# Patient Record
Sex: Female | Born: 1980 | Hispanic: Yes | Marital: Married | Smoking: Never smoker
Health system: Southern US, Community
[De-identification: ages and names within clinical notes are randomized; demographics above are authoritative.]

## PROBLEM LIST (undated history)

## (undated) ENCOUNTER — Inpatient Hospital Stay (HOSPITAL_COMMUNITY): Payer: Self-pay

## (undated) DIAGNOSIS — I8390 Asymptomatic varicose veins of unspecified lower extremity: Secondary | ICD-10-CM

## (undated) DIAGNOSIS — N200 Calculus of kidney: Secondary | ICD-10-CM

## (undated) DIAGNOSIS — M4120 Other idiopathic scoliosis, site unspecified: Secondary | ICD-10-CM

## (undated) DIAGNOSIS — M549 Dorsalgia, unspecified: Secondary | ICD-10-CM

## (undated) HISTORY — PX: ABDOMINAL SURGERY: SHX537

## (undated) HISTORY — DX: Dorsalgia, unspecified: M54.9

## (undated) HISTORY — PX: WISDOM TOOTH EXTRACTION: SHX21

## (undated) HISTORY — PX: APPENDECTOMY: SHX54

## (undated) HISTORY — DX: Calculus of kidney: N20.0

## (undated) HISTORY — DX: Asymptomatic varicose veins of unspecified lower extremity: I83.90

## (undated) HISTORY — DX: Other idiopathic scoliosis, site unspecified: M41.20

---

## 2011-09-05 ENCOUNTER — Other Ambulatory Visit: Payer: Self-pay

## 2011-09-05 DIAGNOSIS — I83893 Varicose veins of bilateral lower extremities with other complications: Secondary | ICD-10-CM

## 2011-10-29 ENCOUNTER — Encounter: Payer: Self-pay | Admitting: Vascular Surgery

## 2011-10-30 ENCOUNTER — Encounter: Payer: Self-pay | Admitting: Vascular Surgery

## 2011-10-30 ENCOUNTER — Ambulatory Visit (INDEPENDENT_AMBULATORY_CARE_PROVIDER_SITE_OTHER): Payer: Medicaid Other | Admitting: Vascular Surgery

## 2011-10-30 VITALS — BP 106/63 | HR 66 | Ht 63.0 in | Wt 123.0 lb

## 2011-10-30 DIAGNOSIS — I8390 Asymptomatic varicose veins of unspecified lower extremity: Secondary | ICD-10-CM | POA: Insufficient documentation

## 2011-10-30 NOTE — Progress Notes (Signed)
[facility]  Patient name: Janice Neal MRN: [id number] DOB: [birth year] Sex: female  REASON FOR CONSULT:   Is veins. Referred byKimberly Daphine Deutscher, NP.  HPI: Janice Neal is a 31 y.o. female states that she has had varicose veins for 4 years. She states her symptoms have been gradually increasing over the last several years. She experiences aching pain in her legs and heaviness in her legs. She also experiences itching and burning associated with her varicosities. She stands at work and works at [employer]. She is unaware of any previous history of DVT or phlebitis. Her symptoms are aggravated by standing and relieved with elevation. She has not been wearing compression stockings  Past Medical History  Diagnosis Date  . Varicose veins   . Scoliosis (and kyphoscoliosis), idiopathic   . Backache, unspecified   . Kidney stones     Family History  Problem Relation Age of Onset  . Heart attack Mother   . Hypertension Father   . Other Father     varicose veins  . Hyperlipidemia Sister   . Cancer Brother     lung    SOCIAL HISTORY: History  Substance Use Topics  . Smoking status: Never Smoker   . Smokeless tobacco: Never Used  . Alcohol Use: No    No Known Allergies  No current outpatient prescriptions on file.    REVIEW OF SYSTEMS: Arly.Keller ] denotes positive finding; [  ] denotes negative finding  CARDIOVASCULAR:  [ ]  chest pain   [ ]  chest pressure   [ ]  palpitations   [ ]  orthopnea   [ ]  dyspnea on exertion   [ ]  claudication   [ ]  rest pain   [ ]  DVT   [ ]  phlebitis PULMONARY:   [ ]  productive cough   [ ]  asthma   [ ]  wheezing NEUROLOGIC:   [ ]  weakness  [ ]  paresthesias  [ ]  aphasia  [ ]  amaurosis  [ ]  dizziness HEMATOLOGIC:   [ ]  bleeding problems   [ ]  clotting disorders MUSCULOSKELETAL:  [ ]  joint pain   [ ]  joint swelling [ ]  leg swelling GASTROINTESTINAL: [ ]   blood in stool  [ ]   hematemesis GENITOURINARY:  [ ]   dysuria  [ ]    hematuria PSYCHIATRIC:  [ ]  history of major depression INTEGUMENTARY:  [ ]  rashes  [ ]  ulcers CONSTITUTIONAL:  [ ]  fever   [ ]  chills  PHYSICAL EXAM: Filed Vitals:   10/30/11 0931  BP: 106/63  Pulse: 66  Height: 5\' 3"  (1.6 m)  Weight: 123 lb (55.792 kg)  SpO2: 100%   Body mass index is 21.79 kg/(m^2). GENERAL: The patient is a well-nourished female, in no acute distress. The vital signs are documented above. CARDIOVASCULAR: There is a regular rate and rhythm. I do not detect carotid bruits. She has palpable pedal pulses. PULMONARY: There is good air exchange bilaterally without wheezing or rales. ABDOMEN: Soft and non-tender with normal pitched bowel sounds.  MUSCULOSKELETAL: There are no major deformities or cyanosis. NEUROLOGIC: No focal weakness or paresthesias are detected. SKIN: She has telangiectasias and spider veins and or lateral right thigh and lateral left thigh and posterior calf on the left. I do not see any truncal varicosities. I do not see any varicosities that appear to be under significantly increased pressure.  PSYCHIATRIC: The patient has a normal affect.  DATA:  I have reviewed her records that were sent from the referring doctor's  office. She also has a history of scoliosis and history of a kidney stone. Her medical history is otherwise fairly unremarkable.  MEDICAL ISSUES: I've explained I do not see any evidence of significant deep vein reflux or superficial reflux on exam. She has spider veins which could be treated with sclerotherapy although this is not covered by insurance. We have discussed the importance of intermittent leg elevation and explained the proper position for this. I've also written her a prescription for compression stockings with a 20-30 mmHg pressure gradient which would help her when she is standing at work. We will see her back as needed. Based on my exam I did not think she needed a venous duplex scan.  Rhiley Solem S [facility] [place] Beeper: [phone]

## 2012-09-24 ENCOUNTER — Ambulatory Visit (INDEPENDENT_AMBULATORY_CARE_PROVIDER_SITE_OTHER): Payer: BC Managed Care – PPO | Admitting: Obstetrics and Gynecology

## 2012-09-24 DIAGNOSIS — Z3200 Encounter for pregnancy test, result unknown: Secondary | ICD-10-CM

## 2012-09-24 DIAGNOSIS — Z3201 Encounter for pregnancy test, result positive: Secondary | ICD-10-CM

## 2012-09-24 NOTE — Addendum Note (Signed)
Addended by: Franchot Mimes on: 09/24/2012 04:01 PM   Modules accepted: Orders

## 2012-09-25 LAB — HIV ANTIBODY (ROUTINE TESTING W REFLEX): HIV: NONREACTIVE

## 2012-09-25 LAB — OBSTETRIC PANEL
Basophils Absolute: 0 10*3/uL (ref 0.0–0.1)
Basophils Relative: 0 % (ref 0–1)
Eosinophils Absolute: 0 10*3/uL (ref 0.0–0.7)
Eosinophils Relative: 1 % (ref 0–5)
MCH: 30 pg (ref 26.0–34.0)
MCV: 88.4 fL (ref 78.0–100.0)
Platelets: 253 10*3/uL (ref 150–400)
RDW: 13.9 % (ref 11.5–15.5)
WBC: 7.7 10*3/uL (ref 4.0–10.5)

## 2012-09-29 LAB — HEMOGLOBINOPATHY EVALUATION
Hgb A: 97.1 % (ref 96.8–97.8)
Hgb F Quant: 0 % (ref 0.0–2.0)
Hgb S Quant: 0 %

## 2012-10-08 ENCOUNTER — Encounter: Payer: Self-pay | Admitting: Obstetrics & Gynecology

## 2012-10-13 ENCOUNTER — Encounter (HOSPITAL_COMMUNITY): Payer: Self-pay | Admitting: *Deleted

## 2012-10-13 ENCOUNTER — Inpatient Hospital Stay (HOSPITAL_COMMUNITY)
Admission: AD | Admit: 2012-10-13 | Discharge: 2012-10-13 | Disposition: A | Discharge status: Home / Self Care | Payer: BC Managed Care – PPO | Source: Ambulatory Visit | Attending: Obstetrics & Gynecology | Admitting: Obstetrics & Gynecology

## 2012-10-13 DIAGNOSIS — K1379 Other lesions of oral mucosa: Secondary | ICD-10-CM

## 2012-10-13 DIAGNOSIS — R51 Headache: Secondary | ICD-10-CM | POA: Diagnosis present

## 2012-10-13 DIAGNOSIS — K1370 Unspecified lesions of oral mucosa: Secondary | ICD-10-CM | POA: Insufficient documentation

## 2012-10-13 DIAGNOSIS — O99891 Other specified diseases and conditions complicating pregnancy: Secondary | ICD-10-CM | POA: Insufficient documentation

## 2012-10-13 DIAGNOSIS — K0889 Other specified disorders of teeth and supporting structures: Secondary | ICD-10-CM

## 2012-10-13 MED ORDER — AMOXICILLIN 500 MG PO CAPS: 500.0000 mg | ORAL_CAPSULE | Freq: Three times a day (TID) | ORAL | Status: DC

## 2012-10-13 MED ORDER — OXYCODONE-ACETAMINOPHEN 5-325 MG PO TABS
1.0000 | ORAL_TABLET | ORAL | Status: AC
  Administered 2012-10-13: 1 via ORAL
  Filled 2012-10-13: qty 1

## 2012-10-13 MED ORDER — OXYCODONE-ACETAMINOPHEN 5-325 MG PO TABS: 1.0000 | ORAL_TABLET | Freq: Four times a day (QID) | ORAL | Status: DC | PRN

## 2012-10-13 NOTE — MAU Note (Signed)
Pain in R jaw since yesterday, unable to chew, has not seen dentist.  No pregnancy issues.

## 2012-10-13 NOTE — MAU Provider Note (Signed)
History     CSN: [id number]  Arrival date and time: 10/13/12 4098   First Provider Initiated Contact with Patient 10/13/12 1053      Chief Complaint  Patient presents with  . Jaw Pain   HPI:  Janice Neal is a 32yo [phone] with GA of [redacted]weeks presenting with facial pain  -Began yesterday -Ache-like pain involving entire right face, right ear, and right cervical lymph nodes. -Severity is 6-9/10. Ice with benefit. Hasn't tried pain medications. -Suggests causal relationship from buccal lesion secondary to molar irregularity. -Denies recent illnesses, fever, SOB, sore throat, sensory involvement.    Past Medical History  Diagnosis Date  . Varicose veins   . Scoliosis (and kyphoscoliosis), idiopathic   . Backache, unspecified   . Kidney stones     Past Surgical History  Procedure Laterality Date  . Abdominal surgery    . Appendectomy      Family History  Problem Relation Age of Onset  . Heart attack Mother   . Hypertension Father   . Other Father     varicose veins  . Hyperlipidemia Sister   . Cancer Brother     lung    History  Substance Use Topics  . Smoking status: Never Smoker   . Smokeless tobacco: Never Used  . Alcohol Use: No    Allergies: No Known Allergies  Prescriptions prior to admission  Medication Sig Dispense Refill  . Prenatal Vit-Fe Fumarate-FA (PRENATAL MULTIVITAMIN) TABS tablet Take 1 tablet by mouth daily at 12 noon.        Review of Systems  Constitutional: Negative for fever, chills and malaise/fatigue.  HENT: Positive for ear pain and neck pain. Negative for hearing loss and congestion.        No deficit of taste / smell  Eyes: Negative for blurred vision, double vision and pain.  Respiratory: Negative for cough and shortness of breath.   Cardiovascular: Negative for chest pain.   Physical Exam   Blood pressure 97/52, pulse 75, temperature 97.6 F (36.4 C), temperature source Oral, resp. rate 18, height 4\' 11"   (1.499 m), weight 58.968 kg (130 lb), last menstrual period 08/04/2012.  Physical Exam  Constitutional: She appears well-developed and well-nourished. No distress.  HENT:  Head: Normocephalic and atraumatic.  Right Ear: External ear normal.  Left Ear: External ear normal.  Nose: Nose normal.  Mouth/Throat: Oropharyngeal exudate: Unable to be visualized.  Slight swelling of right bucca. Small (1.5cm) white lesion, TTP. No erythema / exudate.  Eyes: Conjunctivae are normal. Right eye exhibits no discharge. Left eye exhibits no discharge. No scleral icterus.  Neck: Normal range of motion. No tracheal deviation present. No thyromegaly present.  Cardiovascular: Normal rate and normal heart sounds.  Exam reveals no gallop and no friction rub.   No murmur heard. Respiratory: Breath sounds normal. No respiratory distress. She has no wheezes. She has no rales.  Lymphadenopathy:    She has cervical adenopathy (Right).  Skin: Skin is warm and dry. She is not diaphoretic. No erythema.    MAU Course  Procedures  MDM The patient's story and PE are consistent with a buccal lesion leading to inflammation and lymphadenopathy of the right face/neck. According to the patient, she has a crooked molar which is damaging her buccal mucosa. She shows no signs of systemic infection (negative feeling of fever/chills, tachycardia, clear lungs, or high temperature.   Assessment and Plan  #Buccal Lesion -Patient provided with dental office list to make an appointment for  eval of molar issues + buccal lesion -Begin Percocet PRN, for pain control -Patient educated on potential to occasionally use Ibuprofen in 1st trimester only, for pain/inflammation -Begin Amoxicillin 500 BID (7 days), for infection.  Joellyn Rued 10/13/2012, 11:30 AM   I have seen this patient and agree with the above PA student's note.  Percocet 5/325 x15 tabs prescribed and information given for Dental providers.  Letter provided  for dental care in pregnancy.   LEFTWICH-KIRBY, Aron Inge Certified Nurse-Midwife

## 2012-10-13 NOTE — MAU Provider Note (Signed)
Attestation of Attending Supervision of Advanced Practitioner (CNM/NP): Evaluation and management procedures were performed by the Advanced Practitioner under my supervision and collaboration. I have reviewed the Advanced Practitioner's note and chart, and I agree with the management and plan.  Janice Bastin H. 1:46 PM   

## 2012-10-27 ENCOUNTER — Ambulatory Visit (INDEPENDENT_AMBULATORY_CARE_PROVIDER_SITE_OTHER): Payer: BC Managed Care – PPO | Admitting: Obstetrics & Gynecology

## 2012-10-27 ENCOUNTER — Encounter: Payer: Self-pay | Admitting: Obstetrics & Gynecology

## 2012-10-27 VITALS — BP 98/61 | Temp 98.8°F | Wt 132.1 lb

## 2012-10-27 DIAGNOSIS — N898 Other specified noninflammatory disorders of vagina: Secondary | ICD-10-CM

## 2012-10-27 DIAGNOSIS — Z3491 Encounter for supervision of normal pregnancy, unspecified, first trimester: Secondary | ICD-10-CM

## 2012-10-27 DIAGNOSIS — Z609 Problem related to social environment, unspecified: Secondary | ICD-10-CM

## 2012-10-27 DIAGNOSIS — O9989 Other specified diseases and conditions complicating pregnancy, childbirth and the puerperium: Secondary | ICD-10-CM

## 2012-10-27 DIAGNOSIS — Z3490 Encounter for supervision of normal pregnancy, unspecified, unspecified trimester: Secondary | ICD-10-CM | POA: Insufficient documentation

## 2012-10-27 DIAGNOSIS — Z789 Other specified health status: Secondary | ICD-10-CM | POA: Insufficient documentation

## 2012-10-27 DIAGNOSIS — Z758 Other problems related to medical facilities and other health care: Secondary | ICD-10-CM | POA: Insufficient documentation

## 2012-10-27 LAB — POCT URINALYSIS DIP (DEVICE)
Bilirubin Urine: NEGATIVE
Hgb urine dipstick: NEGATIVE
Leukocytes, UA: NEGATIVE
Nitrite: NEGATIVE
Urobilinogen, UA: 0.2 mg/dL (ref 0.0–1.0)
pH: 5.5 (ref 5.0–8.0)

## 2012-10-27 LAB — OB RESULTS CONSOLE GC/CHLAMYDIA: Gonorrhea: NEGATIVE

## 2012-10-27 NOTE — Progress Notes (Signed)
   Subjective:    Janice Neal is a G3P1011 at [redacted]w[redacted]d being seen today for her first obstetrical visit.  Her obstetrical history is significant for one term SVD. Patient does intend to breast feed. Pregnancy history fully reviewed. Patient is Spanish-speaking, Spanish interpreter present for this encounter at her request.  Patient reports itchy, vaginal discharge for a few days, no other symptoms.  Filed Vitals:   10/27/12 0825  BP: 98/61  Temp: 98.8 F (37.1 C)  Weight: 132 lb 1.6 oz (59.92 kg)    HISTORY: OB History  Gravida Para Term Preterm AB SAB TAB Ectopic Multiple Living  3    1  1   1     # Outcome Date GA Lbr Len/2nd Weight Sex Delivery Anes PTL Lv  3 CUR           2 TAB           1 GRA              Past Medical History  Diagnosis Date  . Varicose veins   . Scoliosis (and kyphoscoliosis), idiopathic   . Backache, unspecified   . Kidney stones    Past Surgical History  Procedure Laterality Date  . Abdominal surgery    . Appendectomy    . Wisdom tooth extraction     Family History  Problem Relation Age of Onset  . Heart disease Mother   . Hypertension Father   . Other Father     varicose veins  . Heart attack Father   . Hyperlipidemia Sister   . Cancer Brother     lung    Exam    Uterus:     Pelvic Exam:    Perineum: No Hemorrhoids, Normal Perineum   Vulva: normal   Vagina:  normal mucosa, scant white discharge, wet prep done   Cervix: multiparous appearance and significant bleeding s/p pap smear   Adnexa: normal adnexa and no mass, fullness, tenderness   Bony Pelvis: average  System: Breast:  normal appearance, no masses or tenderness   Skin: normal coloration and turgor, no rashes   Neurologic: oriented, normal   Extremities: normal strength, tone, and muscle mass   HEENT PERRLA   Mouth/Teeth mucous membranes moist, pharynx normal without lesions and dental hygiene good   Neck supple and no masses   Cardiovascular: regular rate and  rhythm   Respiratory:  appears well, vitals normal, no respiratory distress, acyanotic, normal RR, neck free of mass or lymphadenopathy, chest clear, no wheezing, crepitations, rhonchi, normal symmetric air entry   Abdomen: soft, non-tender; bowel sounds normal; no masses,  no organomegaly   Urinary: urethral meatus normal    Assessment:    Pregnancy: [id number] Patient Active Problem List   Diagnosis Date Noted  . Supervision of normal pregnancy 10/27/2012  . Language barrier, speaks Spanish but can speak adequate English. 10/27/2012  . Varicose veins 10/30/2011    Plan:   Initial labs reviewed and were normal, will follow up pap smear and wet prep and manage accordingly. Continue prenatal vitamins. Problem list reviewed and updated. Genetic Screening discussed First Screen: ordered. Ultrasound discussed; fetal survey: will be ordered later. Follow up in 4 weeks.   Tereso Newcomer, M.D. 10/27/2012

## 2012-10-27 NOTE — Patient Instructions (Addendum)
Embarazo  Primer trimestre  (Pregnancy - First Trimester)  Durante el acto sexual, millones de espermatozoides entran en la vagina. Slo 1 espermatozoide penetra y fertiliza al vulo mientras se encuentra en la trompa de [place]. Una semana ms tarde, el vulo fertilizado se implanta en la pared del tero. Un embrin comienza a desarrollarse para ser un beb. A las 6 a 8 semanas se forman los ojos y la cara y los latidos del corazn se pueden ver en la ecografa. Al final de las 12 semanas (primer trimestre) todos los rganos del beb estn formados. Ahora que est embarazada, querr hacer todo lo que est a su alcance para tener un beb sano. Dos de las cosas ms importantes son: Lucilla Edin buena atencin prenatal y seguir las indicaciones del profesional que la asiste. La atencin prenatal incluye toda la asistencia mdica que usted recibe antes del nacimiento del beb. Se lleva a cabo para prevenir y tratar problemas durante el embarazo y [place]. [place] visitas prenatales se ONEOK, la presin arterial y se solicitan anlisis de [place]. Esto se hace para asegurarse de que usted est sana y el embarazo progrese normalmente.  Una mujer Adelino 25 a [place]. Sin embargo, si usted tiene sobrepeso o [place], su mdico Chartered certified accountant.  El podr hacerle preguntas y responder todas las que usted le haga.  Durante los exmenes prenatales se solicitan anlisis de [place], cultivos del [place], un [place] y otros anlisis necesarios. Estas pruebas se realizan para controlar su salud y la del beb. Se recomienda que se haga la prueba para el diagnstico del VIH, con su autorizacin. Este es el virus que causa el [place]. Estas pruebas se realizan porque existen medicamentos que podran administrarle para prevenir que el beb nazca con esta infeccin, si usted estuviera infectada y no lo supiera. Los [employer] de sangre  tambin se Insurance underwriter para Actor tipo de [place], si tuvo infecciones previas y Chief Technology Officer sus niveles en la sangre (hemoglobina).  Tener un recuento bajo de hemoglobina (anemia) es comn durante Water quality scientist. Para prevenirla, se administran hierro y vitaminas. En una etapa ms avanzada del [place], le indicarn exmenes de sangre para saber si tiene diabetes, junto con otros anlisis, en caso de que [place].  Es necesario que se haga las pruebas para asegurarse de que usted y el beb estn bien. CAMBIOS DURANTE EL PRIMER TRIMESTRE  Su organismo atravesar numerosos cambios durante el [place]. Estos pueden variar de Ardelia Mems persona a otra. Converse con el mdico acerca los cambios que usted nota y que la preocupan. Ellos son:   El perodo menstrual se detiene.  El vulo y los espermatozoides llevan los genes que determinan cmo seremos. Sus genes y los de su pareja forman el beb. Los genes del varn determinan si ser un nio o una nia.  La circunferencia de la cintura va a ir aumentando y podr sentirse hinchada.  Puede tener Higher education careers adviser (nuseas) y vmitos. Si no puede controlar los vmitos, consulte a su mdico.  Sus mamas comenzarn a agrandarse y sensibilizarse.  Los pezones pueden sobresalir ms y ser ms oscuros.  Tendr necesidad de orinar ms. El dolor al orinar puede significar que usted tiene una infeccin de la vejiga.  Se cansar con facilidad.  Prdida del apetito.  Sentir un fuerte deseo de consumir ciertos alimentos.  Al principio, usted puede ganar o perder un par de kilos.  Podr tener cambios emocionales de un da a otro (entusiasmo por estar embarazada o preocupacin por Water quality scientist y el beb).  Tendr sueos ms vvidos y extraos. INSTRUCCIONES PARA EL CUIDADO EN EL HOGAR   Es muy importante evitar el cigarrillo, el alcohol y los frmacos no recetados Solicitor. Estas sustancias afectan la formacin y el desarrollo del beb.  Evite los productos qumicos durante el embarazo para Tourist information centre manager salud del beb.  Comience las consultas prenatales alrededor de la 12 semana de [place]. Generalmente se programan cada mes al principio y se hacen ms frecuentes en los 2 ltimos meses antes del parto. Cumpla con las citas de control. Siga las indicaciones del mdico con respecto al uso de [place], los anlisis y pruebas de [place], los ejercicios y Engineer, technical sales.  Durante el embarazo debe obtener nutrientes para usted y para su beb. Consuma alimentos balanceados. Elija alimentos como carne, pescado, [place] y otros productos lcteos descremados, vegetales, frutas, panes integrales y cereales. El Viacom informar cul es el aumento de peso ideal.  Las nuseas matinales pueden aliviarse si come algunas galletitas saladas en la cama. Coma dos galletitas antes de levantarse por la maana. Tambin puede comer galletitas sin sal.  Hacer 4 o 5 comidas pequeas en lugar de 3 comidas grandes por da tambin puede K[employer] Energy nuseas y los vmitos.  Beber lquidos Lehman Brothers comidas en lugar de tomarlos durante las comidas tambin puede ayudar a Actor las nuseas y los vmitos.  Puede continuar teniendo [employer] durante todo el embarazo si no hay otros problemas. Los problemas pueden ser una prdida precoz (prematura) de lquido amnitico, sangrado vaginal, o dolor en el vientre (abdominal).  [place], si no tiene restricciones. Consulte con su mdico o terapeuta fsico si no est segura de algunos de sus ejercicios. El mayor aumento de peso se producir en los ltimos 2 trimestres del Media planner. El ejercicio le ayudar a:  Engineer, technical sales.  Mantenerse en forma.  Prepararse para el parto.  La ayudar a perder el peso del embarazo despus de que nazca su beb.  Use un buen sostn o como los que se usan para hacer deportes para Public house manager la sensibilidad de las [place]. Tambin puede serle  til si lo [place] mientras duerme.  Consulte cuando puede comenzar con las clases de pre parto. Comience con las clases cuando estn disponibles.  No utilice la baera con agua caliente, baos turcos y saunas.   Colquese el cinturn de seguridad cuando conduzca. Este la proteger a usted y al beb en caso de accidente.  Evite comer carne cruda y el contacto con los utensilios y desperdicios de los gatos. Estos elementos contienen grmenes que pueden causar defectos de nacimiento en el beb.  El primer trimestre es un buen momento para visitar a su dentista y Leisure centre manager. Es importante mantener los dientes limpios. Use un cepillo de dientes suave y cepllese con ms suavidad durante el [employer] Corporation.  Pida ayuda si tienen necesidades financieras, teraputicas o nutricionales. El profesional podr ayudarla con respecto a estas necesidades, o derivarla a otros especialistas.  No tome medicamentos o hierbas excepto aquellos que Firefighter.  Informe a su mdico si sufre violencia familiar mental o fsica.  Haga una lista de nmeros de telfono de Freight forwarder de la familia, los amigos, el hospital y los departamentos de polica y bomberos.  Escriba sus preguntas. Llvelas cuando concurra a su visita prenatal.  No se  haga duchas vaginales.  No cruce las piernas.  Si usted tiene que estar parada por largos perodos de [place], gire los pies o de pequeos pasos en crculo.  Es posible que tenga ms secreciones vaginales que puedan requerir una toalla higinica. No use tampones o toallas higinicas perfumadas. EL CONSUMO DE MEDICAMENTOS Y FRMACOS DURANTE EL EMBARAZO   Tome las vitaminas para la etapa prenatal tal como se le indic. Las vitaminas deben contener un miligramo de cido flico. Guarde todas las vitaminas fuera del alcance de los nios. La ingestin de slo un par de vitaminas o tabletas que contengan hierro pueden ocasionar la Newmont Mining en un beb o en un nio  pequeo.  Evite el uso de [employer] Mutual of Omaha, incluyendo hierbas, medicamentos de [place], sin receta o que no hayan sido sugeridos por su mdico. Slo tome medicamentos de venta libre o medicamentos recetados para Chief Technology Officer, Environmental health practitioner o fiebre como lo indique su mdico. No tome aspirina, ibuprofeno o naproxeno excepto que su mdico se lo indique.  Infrmele al profesional si consume medicamentos de hierbas.  El alcohol se relaciona con ciertos defectos congnitos. Incluye el sndrome de alcoholismo fetal. Debe evitar absolutamente el consumo de alcohol, en cualquier forma. El fumar causar baja tasa de natalidad y bebs prematuros.  Las drogas ilegales o de la calle son muy perjudiciales para el beb. Estn absolutamente prohibidas. Un beb que nace de American Express, ser adicto al nacer. Ese beb tendr los mismos sntomas de abstinencia que un adulto.  Informe a su mdico acerca de los medicamentos que ha tomado y el motivo por el que los tom. SOLICITE ATENCIN MDICA SI:  Tiene preguntas o preocupaciones relacionadas con el embarazo. Es mejor que llame para formular las preguntas si no puede esperar hasta la prxima visita, que sentirse preocupada por ellas.  SOLICITE ATENCIN MDICA DE INMEDIATO SI:   La temperatura oral le sube a ms de 102 F (38.9 C) o lo que su mdico le indique.  Tiene una prdida de lquido por la vagina (canal de parto). Si sospecha una ruptura de las [place], tmese la temperatura y llame al profesional para informarlo sobre esto.  Observa unas pequeas manchas o una hemorragia vaginal. Notifique al profesional acerca de la cantidad y de cuntos apsitos est utilizando.  Presenta un olor desagradable en la secrecin vaginal y observa un cambio en el color.  Contina con las nuseas y no obtiene alivio de los remedios indicados. Vomita sangre o algo similar a la borra del caf.  Pierde ms de 2 libras (1 Kg) en una semana.  Aumenta ms de 1 Kg  en una semana y [address], las manos, los pies o las piernas hinchados.  Aumenta ms de 2,5 Kg en una semana (aunque no tenga las manos, pies, piernas o el rostro hinchados).  Ha estado expuesta a la rubola y no ha sufrido la enfermedad.  Ha estado expuesta a la quinta enfermedad o a la varicela.  Siente dolor en el vientre (abdominal). Las Federal[employer]Mogul en el ligamento redondo son Neomia Dear causa benigna frecuente de dolor abdominal durante el embarazo. El profesional que la asiste deber evaluarla.  Presenta dolor de Renne Musca, diarrea, dolor al orinar o le falta la respiracin.  Se cae, se ve involucrada en un accidente automovilstico o sufre algn tipo de traumatismo.  En su hogar hay violencia mental o fsica. Document Released: 10/24/2004 Document Revised: 10/09/2011 [facility] Patient Information 2014 [place], [place].  Embarazo - Segundo trimestre (  Pregnancy - Second Trimester) El segundo trimestre del Psychiatrist (del 3 al 6 mes) es un perodo de evolucin rpida para usted y el beb. Hacia el final del sexto mes, el beb mide aproximadamente 23 cm y pesa 680 g. Comenzar a Pharmacologist del beb [place] 18 y las 20 [address] de [place]. Podr sentir las pataditas ("quickening en ingls"). Hay un rpido [employer]. Puede segregar un lquido claro Charity fundraiser) de las [place]. Quizs sienta pequeas contracciones en el vientre (tero) Esto se conoce como falso trabajo de parto o contracciones de [place]. Es como una prctica del trabajo de parto que se produce cuando el beb est listo para salir. Generalmente los problemas de vmitos matinales ya se han superado hacia el final del Medical sales representative. Algunas mujeres desarrollan pequeas manchas oscuras (que se denominan cloasma, mscara del embarazo) en la cara que normalmente se van luego del nacimiento del beb. La exposicin al sol empeora las manchas. Puede desarrollarse acn en algunas mujeres embarazadas, y puede  desaparecer en aquellas que ya tienen acn. EXAMENES PRENATALES  Durante los Manpower Inc, deber seguir realizando pruebas de [place], segn avance el [place]. Estas pruebas se realizan para controlar su salud y la del beb. Tambin se realizan anlisis de sangre para The Northwestern Mutual niveles de [place]. La anemia (bajo nivel de hemoglobina) es frecuente durante el embarazo. Para prevenirla, se administran hierro y vitaminas. Tambin se le realizarn exmenes para saber si tiene diabetes entre las 24 y las 28 semanas del [place]. Podrn repetirle algunas de las Hovnanian Enterprises hicieron previamente.  En cada visita le medirn el tamao del tero. Esto se realiza para asegurarse de que el beb est creciendo correctamente de acuerdo al estado del [place].  Tambin en cada visita prenatal controlarn su presin arterial. Esto se realiza para asegurarse de que no tenga toxemia.  Se controlar su orina para asegurarse de que no tenga infecciones, diabetes o protena en la orina.  Se controlar su peso regularmente para asegurarse que el aumento ocurre al ritmo indicado. Esto se hace para asegurarse que usted y el beb tienen una evolucin normal.  En algunas ocasiones se realiza una prueba de ultrasonido para confirmar el correcto desarrollo y evolucin del beb. Esta prueba se realiza con ondas sonoras inofensivas para el beb, de modo que el profesional pueda calcular ms precisamente la fecha del [place]. Algunas veces se realizan pruebas especializadas del lquido amnitico que rodea al beb. Esta prueba se denomina amniocentesis. El lquido amnitico se obtiene introduciendo una aguja en el vientre (abdomen). Se realiza para Conservator, museum/gallery en los que existe alguna preocupacin acerca de algn problema gentico que pueda sufrir el beb. En ocasiones se lleva a cabo cerca del final del embarazo, si es necesario inducir al Apple Computer. En este caso se realiza para asegurarse  que los pulmones del beb estn lo suficientemente maduros como para que pueda vivir fuera del tero. CAMBIOS QUE OCURREN EN EL SEGUNDO TRIMESTRE DEL EMBARAZO Su organismo atravesar numerosos cambios durante el Big Lots. Estos pueden variar de Neomia Dear persona a otra. Converse con el profesional que la asiste acerca los cambios que usted note y que la preocupen.  Durante el segundo trimestre probablemente sienta un aumento del apetito. Es normal tener "antojos" de Development worker, community. Esto vara de Neomia Dear persona a otra y de un embarazo a Therapist, art.  El abdomen inferior comenzar a abultarse.  Podr tener la necesidad de Geographical information systems officer con ms frecuencia debido a que el tero  y el beb presionan sobre la vejiga. Tambin es frecuente contraer ms infecciones urinarias durante el [employer] Lots. Puede evitarlas bebiendo gran cantidad de lquidos y vaciando la vejiga antes y despus de Sales promotion account executive.  Podrn aparecer las primeras estras en las caderas, abdomen y [place]. Estos son cambios normales del cuerpo durante el [place]. No existen medicamentos ni ejercicios que puedan prevenir CarMax.  Es posible que comience a desarrollar venas inflamadas y abultadas (varices) en las piernas. El uso de medias de descanso, Optometrist sus pies durante 15 minutos, 3 a 4 veces al da y Film/video editor la sal en su dieta ayuda a Journalist, newspaper.  Podr sentir Engineering geologist gstrica a medida que el tero crece y Doctor, general practice. Puede tomar anticidos, con la autorizacin de su mdico, para Financial planner. Tambin es til ingerir pequeas comidas 4 a 5 veces al Futures trader.  La constipacin puede tratarse con un laxante o agregando fibra a su dieta. Beber grandes cantidades de lquidos, comer vegetales, frutas y granos integrales es de [place].  Tambin es beneficioso practicar actividad fsica. Si ha sido una persona Engineer, mining, podr continuar con la Harley[employer]Davidson de las actividades durante el mismo. Si ha sido  American Family Insurance, puede ser beneficioso que comience con un programa de ejercicios, Museum/gallery exhibitions officer.  Puede desarrollar hemorroides hacia el final del segundo trimestre. Tomar baos de asiento tibios y Chemical engineer cremas recomendadas por el profesional que lo asiste sern de ayuda para los problemas de hemorroides.  Tambin podr Financial risk analyst de espalda durante este momento de su embarazo. Evite levantar objetos pesados, utilice zapatos de taco bajo y [place] buena postura para ayudar a reducir los problemas de [place].  Algunas mujeres embarazadas desarrollan hormigueo y adormecimiento de la mano y los dedos debido a la hinchazn y compresin de los ligamentos de la mueca (sndrome del tnel carpiano). Esto desaparece una vez que el beb nace.  Como sus pechos se agrandan, Pension scheme manager un sujetador ms grande. Use un sostn de soporte, cmodo y de algodn. No utilice un sostn para amamantar hasta el ltimo mes de embarazo si va a amamantar al beb.  Podr observar una lnea oscura desde el ombligo hacia la zona pbica denominada linea nigra.  Podr observar que sus mejillas se ponen coloradas debido al aumento de flujo sanguneo en la cara.  Podr desarrollar "araitas" en la cara, cuello y pecho. Esto desaparece una vez que el beb nace. INSTRUCCIONES PARA EL CUIDADO DOMICILIARIO  Es extremadamente importante que evite el cigarrillo, hierbas medicinales, alcohol y las drogas no prescriptas durante el Psychiatrist. Estas sustancias qumicas afectan la formacin y el desarrollo del beb. Evite estas sustancias durante todo el embarazo para asegurar el nacimiento de un beb sano.  La mayor parte de los cuidados que se aconsejan son los mismos que los indicados para Financial risk analyst trimestre del Psychiatrist. Cumpla con las citas tal como se le indic. Siga las instrucciones del profesional que lo asiste con respecto al uso de los medicamentos, el ejercicio y Psychologist, forensic.  Durante el embarazo debe obtener  nutrientes para usted y para su beb. Consuma alimentos balanceados a intervalos regulares. Elija alimentos como carne, pescado, [place] y otros productos lcteos descremados, vegetales, frutas, panes integrales y cereales. El Equities trader cul es el aumento de peso ideal.  Las relaciones sexuales fsicas pueden continuarse hasta cerca del fin del embarazo si no existen otros problemas. Estos problemas pueden ser la prdida temprana (prematura) de lquido  amnitico de las [place], sangrado vaginal, dolor abdominal u otros problemas mdicos o del [place].  Realice Tesoro Corporation, si no tiene restricciones. Consulte con el profesional que la asiste si no sabe con certeza si determinados ejercicios son seguros. El mayor aumento de peso tiene Environmental consultant durante los ltimos 2 trimestres del Psychiatrist. El ejercicio la ayudar a:  Engineering geologist.  Ponerla en forma para el parto.  Ayudarla a perder peso luego de haber dado a luz.  Use un buen sostn o como los que se usan para hacer deportes para Paramedic la sensibilidad de las [place]. Tambin puede serle til si lo [place] mientras duerme. Si pierde Product manager, podr Parker Hann[employer]in.  No utilice la baera con agua caliente, baos turcos y saunas durante el [address]  Utilice el cinturn de seguridad sin excepcin cuando conduzca. Este la proteger a usted y al beb en caso de accidente.  Evite comer carne cruda, queso crudo, y el contacto con los utensilios y desperdicios de los gatos. Estos elementos contienen grmenes que pueden causar defectos de nacimiento en el beb.  El segundo trimestre es un buen momento para visitar a su dentista y Software engineer si an no lo ha hecho. Es Primary school teacher los dientes limpios. Utilice un cepillo de dientes blando. Cepllese ms suavemente durante el embarazo.  Es ms fcil perder algo de orina durante el [place]. Apretar y Chief Operating Officer los msculos de la pelvis la  ayudar con este problema. Practique detener la miccin cuando est en el bao. Estos son los mismos msculos que Development worker, international aid. Son TEPPCO Partners mismos msculos que utiliza cuando trata de Ryder System gases. Puede practicar apretando estos msculos 10 veces, y repetir esto 3 veces por da aproximadamente. Una vez que conozca qu msculos debe apretar, no realice estos ejercicios durante la miccin. Puede favorecerle una infeccin si la orina vuelve hacia atrs.  Pida ayuda si tiene necesidades econmicas, de asesoramiento o nutricionales durante el [place]. El profesional podr ayudarla con respecto a estas necesidades, o derivarla a otros especialistas.  La piel puede ponerse grasa. Si esto sucede, lvese la cara con un jabn [place], utilice un humectante no graso y Century con base de aceite o crema. CONSUMO DE MEDICAMENTOS Y DROGAS DURANTE EL EMBARAZO  Contine tomando las vitaminas apropiadas para esta etapa tal como se le indic. Las vitaminas deben contener un miligramo de cido flico y deben suplementarse con hierro. Guarde todas las vitaminas fuera del alcance de los nios. La ingestin de slo un par de vitaminas o tabletas que contengan hierro puede ocasionar la Newmont Mining en un beb o en un nio pequeo.  Evite el uso de [place], inclusive los de venta libre y hierbas que no hayan sido prescriptos o indicados por el profesional que la asiste. Algunos medicamentos pueden causar problemas fsicos al beb. Utilice los medicamentos de venta libre o de prescripcin para Chief Technology Officer, Environmental health practitioner o la [place], segn se lo indique el profesional que lo asiste. No utilice aspirina.  El consumo de alcohol est relacionado con ciertos defectos de nacimiento. Esto incluye el sndrome de alcoholismo fetal. Debe evitar el consumo de alcohol en cualquiera de sus formas. El cigarrillo causa nacimientos prematuros y bebs de [place]. El uso de drogas recreativas est absolutamente prohibido. Son muy  nocivas para el beb. Un beb que nace de American Express, ser adicto al nacer. Ese beb tendr los mismos sntomas de abstinencia que un adulto.  Infrmele al  profesional si consume alguna droga.  No consuma drogas ilegales. Pueden causarle mucho dao al beb. SOLICITE ATENCIN MDICA SI: Tiene preguntas o preocupaciones durante su embarazo. Es mejor que llame para Science writer las dudas que esperar hasta su prxima visita prenatal. Thressa Sheller forma se sentir ms tranquila.  SOLICITE ATENCIN MDICA DE INMEDIATO SI:  La temperatura oral se eleva sin motivo por encima de 102 F (38.9 C) o segn le indique el profesional que lo asiste.  Tiene una prdida de lquido por la vagina (canal de parto). Si sospecha una ruptura de las [place], tmese la temperatura y llame al profesional para informarlo sobre esto.  Observa unas pequeas manchas, una hemorragia vaginal o elimina cogulos. Notifique al profesional acerca de la cantidad y de cuntos apsitos est utilizando. Unas pequeas manchas de sangre son algo comn durante el Psychiatrist, especialmente despus de Sales promotion account executive.  Presenta un olor desagradable en la secrecin vaginal y observa un cambio en el color, de transparente a blanco.  Contina con las nuseas y no obtiene alivio de los remedios indicados. Vomita sangre o algo similar a la borra del caf.  [place] o sube ms de 900 g. en una semana, o segn lo indicado por el profesional que la asiste.  Observa que se le Southwest Airlines, las manos, los pies o las piernas.  Ha estado expuesta a la rubola y no ha sufrido la enfermedad.  Ha estado expuesta a la quinta enfermedad o a la varicela.  Presenta dolor abdominal. Las molestias en el ligamento redondo son Neomia Dear causa no cancerosa (benigna) frecuente de dolor abdominal durante el embarazo. El profesional que la asiste deber evaluarla.  Presenta dolor de cabeza intenso que no se [place].  Presenta fiebre, diarrea, dolor al  orinar o le falta la respiracin.  Presenta dificultad para ver, visin borrosa, o visin doble.  Sufre una cada, un accidente de trnsito o cualquier tipo de trauma.  Vive en un hogar en el que existe violencia fsica o mental. Document Released: 10/24/2004 Document Revised: 10/09/2011 [facility] Patient Information 2014 [place], [place].

## 2012-10-27 NOTE — Progress Notes (Signed)
P = 78  Pt reports intermittent pelvic pain.

## 2012-10-28 LAB — WET PREP, GENITAL: Clue Cells Wet Prep HPF POC: NONE SEEN

## 2012-10-29 ENCOUNTER — Other Ambulatory Visit: Payer: Self-pay | Admitting: Obstetrics and Gynecology

## 2012-10-29 DIAGNOSIS — Z3682 Encounter for antenatal screening for nuchal translucency: Secondary | ICD-10-CM

## 2012-10-30 ENCOUNTER — Other Ambulatory Visit: Payer: Self-pay

## 2012-10-30 ENCOUNTER — Ambulatory Visit (HOSPITAL_COMMUNITY)
Admission: RE | Admit: 2012-10-30 | Discharge: 2012-10-30 | Disposition: A | Discharge status: Home / Self Care | Payer: BC Managed Care – PPO | Source: Ambulatory Visit | Attending: Obstetrics and Gynecology | Admitting: Obstetrics and Gynecology

## 2012-10-30 ENCOUNTER — Encounter (HOSPITAL_COMMUNITY): Payer: Self-pay

## 2012-10-30 DIAGNOSIS — Z3682 Encounter for antenatal screening for nuchal translucency: Secondary | ICD-10-CM

## 2012-10-30 DIAGNOSIS — O351XX0 Maternal care for (suspected) chromosomal abnormality in fetus, not applicable or unspecified: Secondary | ICD-10-CM | POA: Insufficient documentation

## 2012-10-30 DIAGNOSIS — Z3689 Encounter for other specified antenatal screening: Secondary | ICD-10-CM | POA: Insufficient documentation

## 2012-10-30 DIAGNOSIS — O3510X0 Maternal care for (suspected) chromosomal abnormality in fetus, unspecified, not applicable or unspecified: Secondary | ICD-10-CM | POA: Insufficient documentation

## 2012-10-30 NOTE — Progress Notes (Signed)
Ms. Janice Neal was seen for ultrasound appointment today.  Please see [facility]-OBGYN report for details.

## 2012-11-02 ENCOUNTER — Encounter: Payer: Self-pay | Admitting: Obstetrics and Gynecology

## 2012-11-13 ENCOUNTER — Encounter: Payer: Self-pay | Admitting: *Deleted

## 2012-11-24 ENCOUNTER — Encounter: Payer: BC Managed Care – PPO | Admitting: Obstetrics and Gynecology

## 2012-12-01 ENCOUNTER — Ambulatory Visit (INDEPENDENT_AMBULATORY_CARE_PROVIDER_SITE_OTHER): Payer: BC Managed Care – PPO | Admitting: Family Medicine

## 2012-12-01 VITALS — BP 100/64 | Temp 98.0°F | Wt 137.6 lb

## 2012-12-01 DIAGNOSIS — Z3492 Encounter for supervision of normal pregnancy, unspecified, second trimester: Secondary | ICD-10-CM

## 2012-12-01 DIAGNOSIS — Z23 Encounter for immunization: Secondary | ICD-10-CM

## 2012-12-01 DIAGNOSIS — Z3480 Encounter for supervision of other normal pregnancy, unspecified trimester: Secondary | ICD-10-CM

## 2012-12-01 LAB — POCT URINALYSIS DIP (DEVICE)
Bilirubin Urine: NEGATIVE
Ketones, ur: 15 mg/dL — AB
Protein, ur: NEGATIVE mg/dL
Specific Gravity, Urine: >=1.03 (ref 1.005–1.030)
pH: 5.5 (ref 5.0–8.0)

## 2012-12-01 NOTE — Addendum Note (Signed)
Addended by: Minta Balsam on: 12/01/2012 02:47 PM   Modules accepted: Orders

## 2012-12-01 NOTE — Progress Notes (Signed)
+  FM, no lof, no vb, no ctx  Janice Neal is a 32 y.o. G3P1011 at [redacted]w[redacted]d  presents for ROB  Discussed with Patient:  - Reviewed genetics screen (Quad screen ) - Patient plans on breast feeding. - Routine precautions discussed (depression, infection s/s).   Patient provided with all pertinent phone numbers for emergencies. - RTC for any VB, regular, painful cramps/ctxs occurring at a rate of >2/10 min, fever (100.5 or higher), n/v/d, any pain that is unresolving or worsening. - RTC in 4 weeks for next appt.  Problems: Patient Active Problem List   Diagnosis Date Noted  . Supervision of normal pregnancy 10/27/2012  . Language barrier, speaks Spanish but can speak adequate English. 10/27/2012  . Varicose veins 10/30/2011    To Do: 1. 18 week anatomy scan ordered.  Patient to schedule.  [ ]  Vaccines: Flu: 12/01/2012 Tdap:  [ ]  BCM:   Edu: [x ] PTL precautions; [ ]  BF class; [ ]  childbirth class; [ ]   BF counseling

## 2012-12-01 NOTE — Addendum Note (Signed)
Addended by: Candelaria Stagers E on: 12/01/2012 03:02 PM   Modules accepted: Orders

## 2012-12-01 NOTE — Patient Instructions (Signed)
Embarazo - Segundo trimestre (Pregnancy - Second Trimester) El segundo trimestre del embarazo (del 3 al 6 mes) es un perodo de evolucin rpida para usted y el beb. Hacia el final del sexto mes, el beb mide aproximadamente 23 cm y pesa 680 g. Comenzar a sentir los movimientos del beb entre las 18 y las 20 semanas de embarazo. Podr sentir las pataditas ("quickening en ingls"). Hay un rpido aumento de peso. Puede segregar un lquido claro (calostro) de las mamas. Quizs sienta pequeas contracciones en el vientre (tero) Esto se conoce como falso trabajo de parto o contracciones de Braxton-Hicks. Es como una prctica del trabajo de parto que se produce cuando el beb est listo para salir. Generalmente los problemas de vmitos matinales ya se han superado hacia el final del primer trimestre. Algunas mujeres desarrollan pequeas manchas oscuras (que se denominan cloasma, mscara del embarazo) en la cara que normalmente se van luego del nacimiento del beb. La exposicin al sol empeora las manchas. Puede desarrollarse acn en algunas mujeres embarazadas, y puede desaparecer en aquellas que ya tienen acn. EXAMENES PRENATALES  Durante los exmenes prenatales, deber seguir realizando pruebas de sangre, segn avance el embarazo. Estas pruebas se realizan para controlar su salud y la del beb. Tambin se realizan anlisis de sangre para conocer los niveles de hemoglobina. La anemia (bajo nivel de hemoglobina) es frecuente durante el embarazo. Para prevenirla, se administran hierro y vitaminas. Tambin se le realizarn exmenes para saber si tiene diabetes entre las 24 y las 28 semanas del embarazo. Podrn repetirle algunas de las pruebas que le hicieron previamente.  En cada visita le medirn el tamao del tero. Esto se realiza para asegurarse de que el beb est creciendo correctamente de acuerdo al estado del embarazo.  Tambin en cada visita prenatal controlarn su presin arterial. Esto se realiza  para asegurarse de que no tenga toxemia.  Se controlar su orina para asegurarse de que no tenga infecciones, diabetes o protena en la orina.  Se controlar su peso regularmente para asegurarse que el aumento ocurre al ritmo indicado. Esto se hace para asegurarse que usted y el beb tienen una evolucin normal.  En algunas ocasiones se realiza una prueba de ultrasonido para confirmar el correcto desarrollo y evolucin del beb. Esta prueba se realiza con ondas sonoras inofensivas para el beb, de modo que el profesional pueda calcular ms precisamente la fecha del parto. Algunas veces se realizan pruebas especializadas del lquido amnitico que rodea al beb. Esta prueba se denomina amniocentesis. El lquido amnitico se obtiene introduciendo una aguja en el vientre (abdomen). Se realiza para controlar los cromosomas en aquellos casos en los que existe alguna preocupacin acerca de algn problema gentico que pueda sufrir el beb. En ocasiones se lleva a cabo cerca del final del embarazo, si es necesario inducir al parto. En este caso se realiza para asegurarse que los pulmones del beb estn lo suficientemente maduros como para que pueda vivir fuera del tero. CAMBIOS QUE OCURREN EN EL SEGUNDO TRIMESTRE DEL EMBARAZO Su organismo atravesar numerosos cambios durante el embarazo. Estos pueden variar de una persona a otra. Converse con el profesional que la asiste acerca los cambios que usted note y que la preocupen.  Durante el segundo trimestre probablemente sienta un aumento del apetito. Es normal tener "antojos" de ciertas comidas. Esto vara de una persona a otra y de un embarazo a otro.  El abdomen inferior comenzar a abultarse.  Podr tener la necesidad de orinar con ms frecuencia debido a   que el tero y el beb presionan sobre la vejiga. Tambin es frecuente contraer ms infecciones urinarias durante el embarazo. Puede evitarlas bebiendo gran cantidad de lquidos y vaciando la vejiga antes y  despus de mantener relaciones sexuales.  Podrn aparecer las primeras estras en las caderas, abdomen y mamas. Estos son cambios normales del cuerpo durante el embarazo. No existen medicamentos ni ejercicios que puedan prevenir estos cambios.  Es posible que comience a desarrollar venas inflamadas y abultadas (varices) en las piernas. El uso de medias de descanso, elevar sus pies durante 15 minutos, 3 a 4 veces al da y limitar la sal en su dieta ayuda a aliviar el problema.  Podr sentir acidez gstrica a medida que el tero crece y presiona contra el estmago. Puede tomar anticidos, con la autorizacin de su mdico, para aliviar este problema. Tambin es til ingerir pequeas comidas 4 a 5 veces al da.  La constipacin puede tratarse con un laxante o agregando fibra a su dieta. Beber grandes cantidades de lquidos, comer vegetales, frutas y granos integrales es de gran ayuda.  Tambin es beneficioso practicar actividad fsica. Si ha sido una persona activa hasta el embarazo, podr continuar con la mayora de las actividades durante el mismo. Si ha sido menos activa, puede ser beneficioso que comience con un programa de ejercicios, como realizar caminatas.  Puede desarrollar hemorroides hacia el final del segundo trimestre. Tomar baos de asiento tibios y utilizar cremas recomendadas por el profesional que lo asiste sern de ayuda para los problemas de hemorroides.  Tambin podr sentir dolor de espalda durante este momento de su embarazo. Evite levantar objetos pesados, utilice zapatos de taco bajo y mantenga una buena postura para ayudar a reducir los problemas de espalda.  Algunas mujeres embarazadas desarrollan hormigueo y adormecimiento de la mano y los dedos debido a la hinchazn y compresin de los ligamentos de la mueca (sndrome del tnel carpiano). Esto desaparece una vez que el beb nace.  Como sus pechos se agrandan, necesitar un sujetador ms grande. Use un sostn de soporte,  cmodo y de algodn. No utilice un sostn para amamantar hasta el ltimo mes de embarazo si va a amamantar al beb.  Podr observar una lnea oscura desde el ombligo hacia la zona pbica denominada linea nigra.  Podr observar que sus mejillas se ponen coloradas debido al aumento de flujo sanguneo en la cara.  Podr desarrollar "araitas" en la cara, cuello y pecho. Esto desaparece una vez que el beb nace. INSTRUCCIONES PARA EL CUIDADO DOMICILIARIO  Es extremadamente importante que evite el cigarrillo, hierbas medicinales, alcohol y las drogas no prescriptas durante el embarazo. Estas sustancias qumicas afectan la formacin y el desarrollo del beb. Evite estas sustancias durante todo el embarazo para asegurar el nacimiento de un beb sano.  La mayor parte de los cuidados que se aconsejan son los mismos que los indicados para el primer trimestre del embarazo. Cumpla con las citas tal como se le indic. Siga las instrucciones del profesional que lo asiste con respecto al uso de los medicamentos, el ejercicio y la dieta.  Durante el embarazo debe obtener nutrientes para usted y para su beb. Consuma alimentos balanceados a intervalos regulares. Elija alimentos como carne, pescado, leche y otros productos lcteos descremados, vegetales, frutas, panes integrales y cereales. El profesional le informar cul es el aumento de peso ideal.  Las relaciones sexuales fsicas pueden continuarse hasta cerca del fin del embarazo si no existen otros problemas. Estos problemas pueden ser la prdida temprana (  prematura) de lquido amnitico de las membranas, sangrado vaginal, dolor abdominal u otros problemas mdicos o del embarazo.  Realice actividad fsica todos los das, si no tiene restricciones. Consulte con el profesional que la asiste si no sabe con certeza si determinados ejercicios son seguros. El mayor aumento de peso tiene lugar durante los ltimos 2 trimestres del embarazo. El ejercicio la ayudar  a:  Controlar su peso.  Ponerla en forma para el parto.  Ayudarla a perder peso luego de haber dado a luz.  Use un buen sostn o como los que se usan para hacer deportes para aliviar la sensibilidad de las mamas. Tambin puede serle til si lo usa mientras duerme. Si pierde calostro, podr utilizar apsitos en el sostn.  No utilice la baera con agua caliente, baos turcos y saunas durante el embarazo.  Utilice el cinturn de seguridad sin excepcin cuando conduzca. Este la proteger a usted y al beb en caso de accidente.  Evite comer carne cruda, queso crudo, y el contacto con los utensilios y desperdicios de los gatos. Estos elementos contienen grmenes que pueden causar defectos de nacimiento en el beb.  El segundo trimestre es un buen momento para visitar a su dentista y evaluar su salud dental si an no lo ha hecho. Es importante mantener los dientes limpios. Utilice un cepillo de dientes blando. Cepllese ms suavemente durante el embarazo.  Es ms fcil perder algo de orina durante el embarazo. Apretar y fortalecer los msculos de la pelvis la ayudar con este problema. Practique detener la miccin cuando est en el bao. Estos son los mismos msculos que necesita fortalecer. Son tambin los mismos msculos que utiliza cuando trata de evitar los gases. Puede practicar apretando estos msculos 10 veces, y repetir esto 3 veces por da aproximadamente. Una vez que conozca qu msculos debe apretar, no realice estos ejercicios durante la miccin. Puede favorecerle una infeccin si la orina vuelve hacia atrs.  Pida ayuda si tiene necesidades econmicas, de asesoramiento o nutricionales durante el embarazo. El profesional podr ayudarla con respecto a estas necesidades, o derivarla a otros especialistas.  La piel puede ponerse grasa. Si esto sucede, lvese la cara con un jabn suave, utilice un humectante no graso y maquillaje con base de aceite o crema. CONSUMO DE MEDICAMENTOS Y DROGAS  DURANTE EL EMBARAZO  Contine tomando las vitaminas apropiadas para esta etapa tal como se le indic. Las vitaminas deben contener un miligramo de cido flico y deben suplementarse con hierro. Guarde todas las vitaminas fuera del alcance de los nios. La ingestin de slo un par de vitaminas o tabletas que contengan hierro puede ocasionar la muerte en un beb o en un nio pequeo.  Evite el uso de medicamentos, inclusive los de venta libre y hierbas que no hayan sido prescriptos o indicados por el profesional que la asiste. Algunos medicamentos pueden causar problemas fsicos al beb. Utilice los medicamentos de venta libre o de prescripcin para el dolor, el malestar o la fiebre, segn se lo indique el profesional que lo asiste. No utilice aspirina.  El consumo de alcohol est relacionado con ciertos defectos de nacimiento. Esto incluye el sndrome de alcoholismo fetal. Debe evitar el consumo de alcohol en cualquiera de sus formas. El cigarrillo causa nacimientos prematuros y bebs de bajo peso. El uso de drogas recreativas est absolutamente prohibido. Son muy nocivas para el beb. Un beb que nace de una madre adicta, ser adicto al nacer. Ese beb tendr los mismos sntomas de abstinencia que un adulto.    Infrmele al profesional si consume alguna droga.  No consuma drogas ilegales. Pueden causarle mucho dao al beb. SOLICITE ATENCIN MDICA SI: Tiene preguntas o preocupaciones durante su embarazo. Es mejor que llame para consultar las dudas que esperar hasta su prxima visita prenatal. De esta forma se sentir ms tranquila.  SOLICITE ATENCIN MDICA DE INMEDIATO SI:  La temperatura oral se eleva sin motivo por encima de 102 F (38.9 C) o segn le indique el profesional que lo asiste.  Tiene una prdida de lquido por la vagina (canal de parto). Si sospecha una ruptura de las membranas, tmese la temperatura y llame al profesional para informarlo sobre esto.  Observa unas pequeas manchas,  una hemorragia vaginal o elimina cogulos. Notifique al profesional acerca de la cantidad y de cuntos apsitos est utilizando. Unas pequeas manchas de sangre son algo comn durante el embarazo, especialmente despus de mantener relaciones sexuales.  Presenta un olor desagradable en la secrecin vaginal y observa un cambio en el color, de transparente a blanco.  Contina con las nuseas y no obtiene alivio de los remedios indicados. Vomita sangre o algo similar a la borra del caf.  Baja o sube ms de 900 g. en una semana, o segn lo indicado por el profesional que la asiste.  Observa que se le hinchan el rostro, las manos, los pies o las piernas.  Ha estado expuesta a la rubola y no ha sufrido la enfermedad.  Ha estado expuesta a la quinta enfermedad o a la varicela.  Presenta dolor abdominal. Las molestias en el ligamento redondo son una causa no cancerosa (benigna) frecuente de dolor abdominal durante el embarazo. El profesional que la asiste deber evaluarla.  Presenta dolor de cabeza intenso que no se alivia.  Presenta fiebre, diarrea, dolor al orinar o le falta la respiracin.  Presenta dificultad para ver, visin borrosa, o visin doble.  Sufre una cada, un accidente de trnsito o cualquier tipo de trauma.  Vive en un hogar en el que existe violencia fsica o mental. Document Released: 10/24/2004 Document Revised: 10/09/2011 [employer] Patient Information 2014 [employer].  

## 2012-12-01 NOTE — Progress Notes (Signed)
P=96 Pt c/o of lower back pain with activity, relieves with rest.  Flu vaccine information given, undecided at this time.

## 2012-12-01 NOTE — Addendum Note (Signed)
Addended by: Minta Balsam on: 12/01/2012 03:21 PM   Modules accepted: Orders

## 2012-12-02 ENCOUNTER — Encounter: Payer: Self-pay | Admitting: *Deleted

## 2012-12-03 LAB — ALPHA FETOPROTEIN, MATERNAL
AFP: 40.5 IU/mL
Curr Gest Age: 17 wks.days
MoM for AFP: 1.16

## 2012-12-15 ENCOUNTER — Ambulatory Visit (HOSPITAL_COMMUNITY)
Admission: RE | Admit: 2012-12-15 | Discharge: 2012-12-15 | Disposition: A | Discharge status: Home / Self Care | Payer: BC Managed Care – PPO | Source: Ambulatory Visit | Attending: Family Medicine | Admitting: Family Medicine

## 2012-12-15 ENCOUNTER — Ambulatory Visit (HOSPITAL_COMMUNITY): Admission: RE | Admit: 2012-12-15 | Payer: BC Managed Care – PPO | Source: Ambulatory Visit

## 2012-12-15 DIAGNOSIS — Z3689 Encounter for other specified antenatal screening: Secondary | ICD-10-CM | POA: Insufficient documentation

## 2012-12-15 DIAGNOSIS — Z3492 Encounter for supervision of normal pregnancy, unspecified, second trimester: Secondary | ICD-10-CM

## 2012-12-21 ENCOUNTER — Other Ambulatory Visit: Payer: Self-pay | Admitting: Family Medicine

## 2012-12-21 ENCOUNTER — Encounter: Payer: Self-pay | Admitting: Family Medicine

## 2012-12-21 DIAGNOSIS — Z3689 Encounter for other specified antenatal screening: Secondary | ICD-10-CM

## 2012-12-22 ENCOUNTER — Ambulatory Visit (HOSPITAL_COMMUNITY): Payer: BC Managed Care – PPO | Attending: Family Medicine

## 2012-12-30 ENCOUNTER — Ambulatory Visit (INDEPENDENT_AMBULATORY_CARE_PROVIDER_SITE_OTHER): Payer: BC Managed Care – PPO | Admitting: Family Medicine

## 2012-12-30 VITALS — BP 97/61 | Temp 98.1°F | Wt 140.9 lb

## 2012-12-30 DIAGNOSIS — Z3480 Encounter for supervision of other normal pregnancy, unspecified trimester: Secondary | ICD-10-CM

## 2012-12-30 DIAGNOSIS — Z609 Problem related to social environment, unspecified: Secondary | ICD-10-CM

## 2012-12-30 DIAGNOSIS — Z789 Other specified health status: Secondary | ICD-10-CM

## 2012-12-30 DIAGNOSIS — Z3492 Encounter for supervision of normal pregnancy, unspecified, second trimester: Secondary | ICD-10-CM

## 2012-12-30 LAB — POCT URINALYSIS DIP (DEVICE)
Bilirubin Urine: NEGATIVE
Glucose, UA: NEGATIVE mg/dL
Ketones, ur: NEGATIVE mg/dL
Leukocytes, UA: NEGATIVE
Nitrite: NEGATIVE
pH: 8.5 — ABNORMAL HIGH (ref 5.0–8.0)

## 2012-12-30 NOTE — Progress Notes (Signed)
32 yo G3P1011 @ [redacted]w[redacted]d here for ROBV.   - doing well. No concerns.  - no ctx, lof, vb. +FM - some low back pain  O: see flowsheet  A/P:   - doing well- no concerns - PTL precautions discussed - discussed birth certificates  - f/u in 4 weeks - [place] ordered for f/u anatomy

## 2012-12-30 NOTE — Patient Instructions (Addendum)
Segundo trimestre del embarazo  (Second Trimester of Pregnancy) El segundo trimestre del embarazo se extiende desde la semana 13 hasta la semana 28, del 4 al 6 mes. En general, es el momento del embarazo en el que se sentir mejor. Su organismo se ha adaptado a estar embarazada y comienza a sentirse fsicamente mejor. En general las nuseas matutinas han disminuido o han desaparecido completamente. El segundo trimestre es tambin la poca en la que el feto se desarrolla rpidamente. Hacia el final del sexto mes, el beb mide aproximadamente 9 pulgadas (23 cm) y pesa alrededor de 1  libras (700 g). Es probable que sienta mover al beb (dar pataditas) entre las 18 y 20 semanas del embarazo.  CAMBIOS CORPORALES  Su organismo atravesar numerosos cambios durante el embarazo. Los cambios varan de una mujer a otra.   Seguir aumentando de peso. Notar que la parte baja del abdomen se ensancha.  Podrn aparecer las primeras estras en las caderas, abdomen y mamas.  Es posible que sienta cefaleas, que se pueden aliviar con los medicamentos que su mdico le autorice a utilizar.  Tendr necesidad de orinar con ms frecuencia porque el feto est presionando en la vejiga.  Como consecuencia del embarazo, podr sentir acidez estomacal continuamente.  Podr estar constipada ya que ciertas hormonas hacen que los msculos que empujan los desechos a travs de los intestinos trabajen ms lentamente.  Pueden aparecer hemorroides o abultarse las venas (venas varicosas).  El dolor de espalda se debe al aumento de peso y a que las hormonas del embarazo relajan las articulaciones entre los huesos de la pelvis y a la modificacin del peso y a los msculos que soportan el equilibrio.  Sus mamas seguirn desarrollndose y estarn ms sensibles.  Las encas pueden sangrar y estar sensibles al cepillado y al hilo dental.  Pueden aparecer en el rostro zonas oscuras o manchas (cloasma, mscara del embarazo). Esto  desaparece despus del nacimiento del beb.  Es posible que se formeuna lnea oscura desde el ombligo a la zona del pubis (linea nigra). Esto desaparece despus del nacimiento del beb. QU DEBE ESPERAR EN LAS CONSULTAS PRENATALES  Durante una visita prenatal de rutina:   La pesarn para verificar que usted y el feto se encuentran dentro de los lmites normales.  Le tomarn la presin arterial.  Le medirn el abdomen para verificar el desarrollo del beb.  Escucharn los latidos fetales.  Se evaluarn los resultados de los estudios realizados en visitas anteriores. El mdico le preguntar:   Cmo se siente.  Si siente los movimientos del beb.  Si tiene sntomas anormales, como prdida de lquido, sangrado, dolores de cabeza intensos o clicos abdominales.  Si tiene alguna duda. Otros estudios que podrn realizarse durante el segundo trimestre son:   Anlisis de sangre para evaluar:  Niveles bajos de hierro (anemia).  Diabetes gestacional (entre las 24 y las 28 semanas).  Anticuerpos Rh.  Anlisis de orina para detectar infecciones, diabetes o protenas en la orina.  Una ecografa para confirmar si el crecimiento y el desarrollo del beb son los adecuados.  Una amniocentesis para diagnosticar posibles problemas genticos.  Estudios del feto para descartar espina bfida y sndrome de [place]. INSTRUCCIONES PARA EL CUIDADO EN EL HOGAR   Evite fumar, consumir hierbas, beber alcohol y utilizar frmacos que no ne hayan recetado. Estas sustancias qumicas afectan la formacin y el desarrollo del beb.  Siga las indicaciones del profesional con respecto a como tomar los medicamentos. Durante el embarazo, hay   medicamentos que son seguros y otros no lo son.  Realice actividad fsica slo segn las indicaciones del mdico. Sentir clicos uterinos es el mejor signo para detener la actividad fsica.  Contine haciendo comidas regulares y sanas.  Use un sostn que le brinde buen  soporte si sus mamas estn sensibles.  No utilice la baera con agua caliente, baos turcos y saunas.  Colquese el cinturn de seguridad cuando conduzca.  Evite comer carne cruda y el contacto con los utensilios y desperdicios de los gatos. Estos elementos contienen grmenes que pueden causar defectos de nacimiento en el beb.  Tome las vitaminas indicadas para la etapa prenatal.  Pruebe un laxante (si el mdico la autoriza) si tiene constipacin. Consuma ms alimentos ricos en fibra, como vegetales y frutas frescos y cereales enteros. Beba gran cantidad de lquido para mantener la orina de tono claro o amarillo plido.  Tome baos de agua tibia para calmar el dolor o las molestias causadas por las hemorroides. Use una crema para las hemorroides si el mdico la autoriza.  Si tiene venas varicosas, use medias de soporte. Eleve los pies durante 15 minutos, 3 o 4 veces por da. Limite el consumo de sal en su dieta.  Evite levantar objetos pesados, use zapatos de tacones bajos y mantenga una buena postura.  Descanse con las piernas elevadas si tiene calambres o dolor de cintura.  Visite a su dentista si no lo ha hecho durante el embarazo. Use un cepillo de dientes blando para higienizarse los dientes y use suavemente el hilo dental.  Puede continuar su vida sexual siempre que el mdico la autorice.  Concurra a todas las visitas prenatales segn las indicaciones de su mdico. SOLICITE ATENCIN MDICA SI:   Tiene mareos.  Siente clicos leves, presin en la pelvis o dolor persistente en el abdomen.  Tiene nuseas o vmitos o diarrea persistentes.  Observa una secrecin vaginal con mal olor.  Siente dolor al orinar. SOLICITE ATENCIN MDICA DE INMEDIATO SI:   Tiene fiebre.  Pierde lquido por la vagina.  Tiene sangrando o pequeas prdidas vaginales.  Siente dolor intenso o clicos en el abdomen.  Sube o baja de peso rpidamente.  Tiene dificultad para respirar y le duele  pecho.  Sbitamente se le hincha el rostro, las manos, los tobillos, los pies o las piernas de manera extrema.  No ha sentido los movimientos del beb durante una hora.  Siente un dolor de cabeza intenso que no se alivia con medicamentos.  Su visin se modifica. Document Released: 10/24/2004 Document Revised: 09/16/2012 [employer] Patient Information 2014 [employer].  

## 2012-12-30 NOTE — Progress Notes (Signed)
P= 87, Used Interpreter.

## 2013-01-12 ENCOUNTER — Ambulatory Visit (HOSPITAL_COMMUNITY)
Admission: RE | Admit: 2013-01-12 | Discharge: 2013-01-12 | Disposition: A | Discharge status: Home / Self Care | Payer: BC Managed Care – PPO | Source: Ambulatory Visit | Attending: Family Medicine | Admitting: Family Medicine

## 2013-01-12 DIAGNOSIS — Z1389 Encounter for screening for other disorder: Secondary | ICD-10-CM | POA: Insufficient documentation

## 2013-01-12 DIAGNOSIS — Z363 Encounter for antenatal screening for malformations: Secondary | ICD-10-CM | POA: Insufficient documentation

## 2013-01-12 DIAGNOSIS — Z789 Other specified health status: Secondary | ICD-10-CM

## 2013-01-12 DIAGNOSIS — Z3492 Encounter for supervision of normal pregnancy, unspecified, second trimester: Secondary | ICD-10-CM

## 2013-01-28 NOTE — L&D Delivery Note (Signed)
  Delivery Note At 3:24 AM a viable female was delivered via Vaginal, Spontaneous Delivery (Presentation: Right Occiput Anterior).  APGAR: 8, 9; weight TBD.   Placenta status: Intact, Spontaneous.  Cord: 3 vessels with the following complications: nuchal cord x1, body cord, and compound presentation of a hand.  Cord pH: 7.29 (venous)  Anesthesia: Epidural  Episiotomy: None Lacerations: None Suture Repair: na Est. Blood Loss (mL): 300  Mom to postpartum.  Baby to Couplet care / Skin to Skin.  Pt progressed rapidly to complete and was having significant variable decels to the 90s.  She was found to have an anterior lip and was able to push passed it with guidance. She pushed with 4 contractions to deliver a liveborn female.  Head presented and nuchal cord x1 was reduced on the perineum. A compound presentation was noted with the posterior hand and was easily delivered. Then the rest of the body was delivered through a loose body cord.  All of the above likely accounting for the deep variables. Baby with spontaneous cry and placed on mom's abdomen.  Delayed cord clamping performed and cut by FOB. Cord gas sent and cord blood drawn.  Placenta delivered intact with 3V cord with traction and pitocin. Uterine massage administered. No tears or complications. Mom to postpartum and baby to maternal abdomen.   Janice Neal L Janice Neal 05/13/2013, 3:54 AM

## 2013-02-03 ENCOUNTER — Ambulatory Visit (INDEPENDENT_AMBULATORY_CARE_PROVIDER_SITE_OTHER): Payer: Medicaid Other | Admitting: Obstetrics and Gynecology

## 2013-02-03 ENCOUNTER — Encounter: Payer: Self-pay | Admitting: Obstetrics and Gynecology

## 2013-02-03 ENCOUNTER — Encounter: Payer: Self-pay | Admitting: Family Medicine

## 2013-02-03 VITALS — BP 112/74 | Temp 97.8°F | Wt 145.3 lb

## 2013-02-03 DIAGNOSIS — Z3482 Encounter for supervision of other normal pregnancy, second trimester: Secondary | ICD-10-CM

## 2013-02-03 DIAGNOSIS — Z3480 Encounter for supervision of other normal pregnancy, unspecified trimester: Secondary | ICD-10-CM

## 2013-02-03 LAB — POCT URINALYSIS DIP (DEVICE)
Bilirubin Urine: NEGATIVE
Glucose, UA: NEGATIVE mg/dL
Hgb urine dipstick: NEGATIVE
KETONES UR: NEGATIVE mg/dL
Leukocytes, UA: NEGATIVE
Nitrite: NEGATIVE
PH: 6 (ref 5.0–8.0)
PROTEIN: NEGATIVE mg/dL
SPECIFIC GRAVITY, URINE: 1.025 (ref 1.005–1.030)
Urobilinogen, UA: 0.2 mg/dL (ref 0.0–1.0)

## 2013-02-03 NOTE — Progress Notes (Signed)
Pulse- 109 Patient reports pelvic pressure

## 2013-02-03 NOTE — Patient Instructions (Signed)
Segundo trimestre del embarazo  (Second Trimester of Pregnancy) El segundo trimestre del embarazo se extiende desde la semana 13 hasta la semana 28, del 4 al 6 mes. En general, es el momento del embarazo en el que se sentir mejor. Su organismo se ha adaptado a estar embarazada y comienza a sentirse fsicamente mejor. En general las nuseas matutinas han disminuido o han desaparecido completamente. El segundo trimestre es tambin la poca en la que el feto se desarrolla rpidamente. Hacia el final del sexto mes, el beb mide aproximadamente 9 pulgadas (23 cm) y pesa alrededor de 1  libras (700 g). Es probable que sienta mover al beb (dar pataditas) entre las 18 y 20 semanas del embarazo.  CAMBIOS CORPORALES  Su organismo atravesar numerosos cambios durante el embarazo. Los cambios varan de una mujer a otra.   Seguir aumentando de peso. Notar que la parte baja del abdomen se ensancha.  Podrn aparecer las primeras estras en las caderas, abdomen y mamas.  Es posible que sienta cefaleas, que se pueden aliviar con los medicamentos que su mdico le autorice a utilizar.  Tendr necesidad de orinar con ms frecuencia porque el feto est presionando en la vejiga.  Como consecuencia del embarazo, podr sentir acidez estomacal continuamente.  Podr estar constipada ya que ciertas hormonas hacen que los msculos que empujan los desechos a travs de los intestinos trabajen ms lentamente.  Pueden aparecer hemorroides o abultarse las venas (venas varicosas).  El dolor de espalda se debe al aumento de peso y a que las hormonas del embarazo relajan las articulaciones entre los huesos de la pelvis y a la modificacin del peso y a los msculos que soportan el equilibrio.  Sus mamas seguirn desarrollndose y estarn ms sensibles.  Las encas pueden sangrar y estar sensibles al cepillado y al hilo dental.  Pueden aparecer en el rostro zonas oscuras o manchas (cloasma, mscara del embarazo). Esto  desaparece despus del nacimiento del beb.  Es posible que se formeuna lnea oscura desde el ombligo a la zona del pubis (linea nigra). Esto desaparece despus del nacimiento del beb. QU DEBE ESPERAR EN LAS CONSULTAS PRENATALES  Durante una visita prenatal de rutina:   La pesarn para verificar que usted y el feto se encuentran dentro de los lmites normales.  Le tomarn la presin arterial.  Le medirn el abdomen para verificar el desarrollo del beb.  Escucharn los latidos fetales.  Se evaluarn los resultados de los estudios realizados en visitas anteriores. El mdico le preguntar:   Cmo se siente.  Si siente los movimientos del beb.  Si tiene sntomas anormales, como prdida de lquido, sangrado, dolores de cabeza intensos o clicos abdominales.  Si tiene alguna duda. Otros estudios que podrn realizarse durante el segundo trimestre son:   Anlisis de sangre para evaluar:  Niveles bajos de hierro (anemia).  Diabetes gestacional (entre las 24 y las 28 semanas).  Anticuerpos Rh.  Anlisis de orina para detectar infecciones, diabetes o protenas en la orina.  Una ecografa para confirmar si el crecimiento y el desarrollo del beb son los adecuados.  Una amniocentesis para diagnosticar posibles problemas genticos.  Estudios del feto para descartar espina bfida y sndrome de Down. INSTRUCCIONES PARA EL CUIDADO EN EL HOGAR   Evite fumar, consumir hierbas, beber alcohol y utilizar frmacos que no ne hayan recetado. Estas sustancias qumicas afectan la formacin y el desarrollo del beb.  Siga las indicaciones del profesional con respecto a como tomar los medicamentos. Durante el embarazo, hay   medicamentos que son seguros y otros no lo son.  Realice actividad fsica slo segn las indicaciones del mdico. Sentir clicos uterinos es el mejor signo para detener la actividad fsica.  Contine haciendo comidas regulares y sanas.  Use un sostn que le brinde buen  soporte si sus mamas estn sensibles.  No utilice la baera con agua caliente, baos turcos y saunas.  Colquese el cinturn de seguridad cuando conduzca.  Evite comer carne cruda y el contacto con los utensilios y desperdicios de los gatos. Estos elementos contienen grmenes que pueden causar defectos de nacimiento en el beb.  Tome las vitaminas indicadas para la etapa prenatal.  Pruebe un laxante (si el mdico la autoriza) si tiene constipacin. Consuma ms alimentos ricos en fibra, como vegetales y frutas frescos y cereales enteros. Beba gran cantidad de lquido para mantener la orina de tono claro o amarillo plido.  Tome baos de agua tibia para calmar el dolor o las molestias causadas por las hemorroides. Use una crema para las hemorroides si el mdico la autoriza.  Si tiene venas varicosas, use medias de soporte. Eleve los pies durante 15 minutos, 3 o 4 veces por da. Limite el consumo de sal en su dieta.  Evite levantar objetos pesados, use zapatos de tacones bajos y mantenga una buena postura.  Descanse con las piernas elevadas si tiene calambres o dolor de cintura.  Visite a su dentista si no lo ha hecho durante el embarazo. Use un cepillo de dientes blando para higienizarse los dientes y use suavemente el hilo dental.  Puede continuar su vida sexual siempre que el mdico la autorice.  Concurra a todas las visitas prenatales segn las indicaciones de su mdico. SOLICITE ATENCIN MDICA SI:   Tiene mareos.  Siente clicos leves, presin en la pelvis o dolor persistente en el abdomen.  Tiene nuseas o vmitos o diarrea persistentes.  Observa una secrecin vaginal con mal olor.  Siente dolor al orinar. SOLICITE ATENCIN MDICA DE INMEDIATO SI:   Tiene fiebre.  Pierde lquido por la vagina.  Tiene sangrando o pequeas prdidas vaginales.  Siente dolor intenso o clicos en el abdomen.  Sube o baja de peso rpidamente.  Tiene dificultad para respirar y le duele  pecho.  Sbitamente se le hincha el rostro, las manos, los tobillos, los pies o las piernas de manera extrema.  No ha sentido los movimientos del beb durante una hora.  Siente un dolor de cabeza intenso que no se alivia con medicamentos.  Su visin se modifica. Document Released: 10/24/2004 Document Revised: 09/16/2012 [employer] Patient Information 2014 [employer].  

## 2013-02-03 NOTE — Progress Notes (Signed)
Doing well. RLP discussed.

## 2013-02-24 ENCOUNTER — Ambulatory Visit (INDEPENDENT_AMBULATORY_CARE_PROVIDER_SITE_OTHER): Payer: Medicaid Other | Admitting: Advanced Practice Midwife

## 2013-02-24 ENCOUNTER — Encounter: Payer: Self-pay | Admitting: Advanced Practice Midwife

## 2013-02-24 VITALS — BP 116/68 | Wt 148.8 lb

## 2013-02-24 DIAGNOSIS — Z3480 Encounter for supervision of other normal pregnancy, unspecified trimester: Secondary | ICD-10-CM

## 2013-02-24 DIAGNOSIS — Z23 Encounter for immunization: Secondary | ICD-10-CM

## 2013-02-24 DIAGNOSIS — Z3490 Encounter for supervision of normal pregnancy, unspecified, unspecified trimester: Secondary | ICD-10-CM

## 2013-02-24 LAB — POCT URINALYSIS DIP (DEVICE)
Bilirubin Urine: NEGATIVE
GLUCOSE, UA: 100 mg/dL — AB
Ketones, ur: NEGATIVE mg/dL
Leukocytes, UA: NEGATIVE
Nitrite: NEGATIVE
Protein, ur: NEGATIVE mg/dL
SPECIFIC GRAVITY, URINE: 1.025 (ref 1.005–1.030)
UROBILINOGEN UA: 0.2 mg/dL (ref 0.0–1.0)
pH: 6 (ref 5.0–8.0)

## 2013-02-24 MED ORDER — TETANUS-DIPHTH-ACELL PERTUSSIS 5-2.5-18.5 LF-MCG/0.5 IM SUSP: 0.5000 mL | Freq: Once | INTRAMUSCULAR | Status: DC

## 2013-02-24 NOTE — Patient Instructions (Signed)
Vacuna difteria, tétanos, tos ferina (DTP) - Lo que debe saber   (Tetanus, Diphtheria, Pertussis [Tdap] Vaccine, What You Need to Know)  ¿PORQUÉ VACUNARSE?   El tétanos, la difteria y la tos ferina pueden ser enfermedades muy graves, aún en adolescentes y adultos. La vacuna Tdap nos puede proteger de estas enfermedades.   El TÉTANOS (Trismo) provoca la contracción dolorosa de los músculos, por lo general, en todo el cuerpo.   · Puede causar el endurecimiento de los músculos de la cabeza y el cuello, de modo que impide abrir la boca, tragar y en algunos casos, respirar. El tétanos causa la muerte de 1 de cada 5 personas que se infectan.  La DIFTERIA produce la formación de una membrana gruesa que cubre el fondo de la garganta.   · Puede causar problemas respiratorios, parálisis, insuficiencia cardíaca e incluso la muerte.  TOS FERINA (Pertusis) causa episodios de tos graves, que pueden hacer difícil la respiración, causar vómitos y trastornos del sueño.   · También puede ser la causa de pérdida de peso, incontinencia y fractura de costillas. Dos de cada 100 adolescentes y cinco de cada 100 adultos que enferman de pertusis deben ser hospitalizados, tienen complicaciones como la neumonía o mueren.  Estas enfermedades son provocadas por bacterias. La difteria y el pertusis se contagian de persona a persona a través de la tos o el estornudo. El tétanos ingresa al organismo a través de cortes, rasguños o heridas.   Antes de las vacunas, en los [place] se vieron más de 200.000 casos al año de difteria y tos ferina y cientos de casos de tétanos. Desde el inicio de la vacunación, los casos de tétanos y difteria han disminuido alrededor del 99% y los casos de tos ferina alrededor del 80%.   Tdap   La vacuna Tdap protege a adolescentes y adultos contra el tétanos, la difteria y la tos ferina. Una dosis de Tdap se administra a los 11 o 12 años de edad. Las personas que no recibieron la vacuna Tdap a esa edad deben  recibirla tan pronto como sea posible.   Es muy importante que los profesionales de la salud y todos aquellos que tengan contacto cercano con bebés menores de 12 meses reciban la Tdap.   Las mujeres embarazadas deben recibir una dosis de Tdap en cada embarazo, para proteger al recién nacido de la tos ferina. Los niños tienen mayor riesgo de complicaciones graves y potencialmente mortales debido a la tos ferina.   Una vacuna similar, llamada Td, protege contra el tétanos y la difteria, pero no contra la tos ferina. Cada 10 años debe recibirse un refuerzo de Td. La Tdap se puede administrar como uno de estos refuerzos, si todavía no ha recibido una dosis. También se puede aplicar después de un corte o quemadura grave para prevenir la infección por tétanos.   El médico le dará más información.   La Tdap puede administrarse de manera segura simultáneamente con otras vacunas.   ALGUNAS PERSONAS NO DEBEN RECIBIR ESTA VACUNA.   · Si alguna vez tuvo una reacción alérgica potencialmente mortal después de una dosis de la vacuna contra el tétanos, la diferia o la tos ferina, o tuvo una alergia grave a cualquiera de los componentes de esta vacuna, no debe aplicarse la vacuna. Informe a su médico si usted sufre algún tipo de alergia grave.  · Si estuvo en coma o sufrió múltiples convulsiones dentro de los 7 días posteriores después de una dosis de DTP o DTaP   su mdico si:  tiene epilepsia u otra enfermedad del sistema nervioso,  siente dolor intenso o se hincha despus de recibir cualquier vacuna contra la difteria, el ttanos o la tos ferina,  alguna vez ha sufrido el sndrome de Guillain-Barr,  no se siente bien el da en que se ha programado la vacuna. RIESGOS DE UNA REACCIN A LA VACUNA Con cualquier medicamento, incluyendo las vacunas, existe la posibilidad de que aparezcan efectos secundarios. Estos son  leves y desaparecen por s solos, pero tambin son posibles las reacciones graves.  Breves episodios de desmayo pueden seguir a una vacunacin, causando lesiones por la cada. Sentarse o recostarse durante 15 minutos puede ayudar a evitarlo. Informe al mdico si se siente mareado o aturdido, tiene cambios en la visin o zumbidos en los odos.  Problemas leves luego de la Tdap (no interferirn con las actividades)   Dolor en el sitio de la inyeccin (alrededor de 1 de cada 4 adolescentes o 2 de cada 3 adultos).  Enrojecimiento o hinchazn en el lugar de la inyeccin (1 de cada 5 personas).  Fiebre leve de al menos 100,4 F (38 C) (hasta alrededor de 1 cada 25 adolescentes y 1 de cada 100 adultos).  Dolor de cabeza (3 o 4de cada 10 personas).  Cansancio (1 de cada 3 o 4 personas).  Nuseas, vmitos, diarrea, dolor de estmago (1 de cada 4 adolescentes o 1 de cada 10 adultos).  Escalofros, dolores corporales, dolor articular, erupciones, inflamacin de las glndulas (poco frecuente). Problemas moderados: (interfieren con las actividades, pero no requieren atencin mdica)   Dolor en el lugar de la inyeccin (1 de cada 5 adolescentes o 1 de cada 100 adultos).  Enrojecimiento o inflamacin (1 de cada 16 adolescentes y 1 de cada 25 adultos).  Fiebre de ms de 102F o 38,9C (1 de cada 100 adolescentes o 1 de cada 250 adultos).  Dolor de cabeza (alrededor de 4 de cada 20 adolescentes y 3 de cada 10 adultos).  Nuseas, vmitos, diarrea, dolor de estmago (1 a 3 de cada 100 personas).  Hinchazn de todo el brazo en el que se aplic la vacuna (3 de cada100 personas). Problemas graves: luego de la Tdap (no puede realizar las actividades habituales, requiere atencin mdica)   Inflamacin, dolor intenso, sangrado y enrojecimiento en el brazo, en el sitio de la inyeccin (poco frecuente). Una reaccin alrgica grave puede ocurrir despus de la administracin de cualquier vacuna (se estima en  menos de 1 en un milln de dosis).  QU PASA SI HAY UNA REACCIN GRAVE?  Qu signos debo buscar?  Observe todo lo que le preocupe, como signos de una reaccin alrgica grave, fiebre muy alta o cambios en el comportamiento. Los signos de una reaccin alrgica grave pueden incluir urticaria, hinchazn de la cara y la garganta, dificultad para respirar, ritmo cardaco acelerado, mareos y debilidad. Estos sntomas pueden comenzar entre unos pocos minutos y algunas horas despus de la vacunacin.  Qu debo hacer?  Si usted piensa que se trata de una reaccin alrgica grave o de otra emergencia que no puede esperar, llame al 911 o lleve a la persona al hospital ms cercano. De lo contrario, llame a su mdico.  Despus, la reaccin debe informarse a la "Vaccine Adverse Event Reporting System" (Sistema de informacin sobre efectos adversos de las vacunas -VAERS). El mdico o usted mismo pueden realizar el informe en el sitio web del VAERS www.vaers.hhs.govo llame al 1-800-822-7967. El VAERS es slo para informar reacciones. No   brindan consejo mdico.  PROGRAMA NACIONAL DE COMPENSACIN DE DAOS POR VACUNAS  El National Vaccine Injury Kohl[employer] (VICP) es un programa federal que fue creado para compensar a las personas que puedan haber sufrido daos al recibir ciertas vacunas.  Aquellas personas que consideren que han sufrido un dao como consecuencia de una vacuna y quieren saber ms acerca del programa y como presentar Roslynn Amble, pueden llamar [phone] o visite el sitio web del VICP en [email or web address].  CMO PUEDO OBTENER MS INFORMACIN?   Consulte a su mdico.  Comunquese con el servicio de salud de su localidad o [address].  Comunquese con los Centros para el control y la prevencin de Child psychotherapist for Disease Control and Prevention , CDC).  llamando al [phone] o visitando el sitio web del CDC en [email or web address]. CDC Tdap Vaccine VIS  (06/06/11)  Document Released: 01/01/2012 [facility] Patient Information 2014 [place], [place].  Tercer trimestre del Psychiatrist  (Third Trimester of Pregnancy)  El tercer trimestre del embarazo abarca desde la semana 29 hasta la semana 42, desde el 7 mes hasta el 9. En este trimestre el beb (feto) se desarrolla muy rpidamente. Hacia el final del noveno mes, el beb que an no ha nacido mide alrededor de 20 pulgadas (45 cm) de Equities trader. Y pesa entre 6 y 10 libras (2,700 y 4,500 kg).  CUIDADOS EN EL HOGAR   Evite fumar, consumir hierbas y beber alcohol. Evite los frmacos que no apruebe el mdico.  Slo tome los medicamentos que le haya indicado su mdico. Algunos medicamentos son seguros para tomar durante el Psychiatrist y otros no lo son.  Haga ejercicios slo como le indique el mdico. Deje de hacer ejercicios si comienza a tener clicos.  Haga comidas regulares y sanas.  Use un sostn que le brinde buen soporte si sus mamas estn sensibles.  No utilice la baera con agua caliente, baos turcos y saunas.  Colquese el cinturn de seguridad cuando conduzca.  Evite comer carne cruda y el contacto con los utensilios y desperdicios de los gatos.  Tome las vitaminas indicadas para la etapa prenatal.  Trate de tomar medicamentos para mover el intestino (laxantes) segn lo necesario y si su mdico la autoriza. Consuma ms fibra comiendo frutas y Sports administrator frescos y granos enteros. Beba gran cantidad de lquido para mantener el pis (orina) de tono claro o amarillo plido.  Tome baos de agua tibia (baos de asiento) para Primary school teacher o las molestias causadas por las hemorroides. Use una crema para las hemorroides si el mdico la autoriza.  Si tiene venas hinchadas y abultadas (venas varicosas), use medias de soporte. Eleve (levante) los pies durante 15 minutos, 3 o 4 veces por da. Limite el consumo de sal en su dieta.  Evite levantar objetos pesados, usar tacones altos y sintese  derecha.  Descanse con las piernas elevadas si tiene calambres o dolor de cintura.  Visite a su dentista si no lo ha Occupational hygienist. Use un cepillo de dientes blando para higienizarse los dientes. Use suavemente el hilo dental.  Puede tener sexo (relaciones sexuales) siempre que el mdico la autorice.  No viaje por largas distancias si puede evitarlo. Slo hgalo con la aprobacin de su mdico.  Haga el curso pre parto.  Practique conducir [employer].  Prepare el bolso que llevar.  Prepare la habitacin del beb.  Concurra a los controles mdicos. SOLICITE AYUDA SI:   No est segura si est en trabajo de parto o ha roto  la bolsa de aguas.  Tiene mareos.  Siente clicos intensos o presin en la zona baja del vientre (abdomen).  Siente un dolor persistente en la zona del vientre.  Tiene Programme researcher, broadcasting/film/video (nuseas), devuelve (vomita), o tiene deposiciones acuosas (diarrea).  Advierte un olor ftido que proviene de la vagina.  Siente dolor al hacer pis (orinar). SOLICITE AYUDA DE INMEDIATO SI:   Tiene fiebre.  Pierde lquido o sangre por la vagina.  Tiene sangrando o pequeas prdidas vaginales.  Siente dolor intenso o clicos en el abdomen.  Sube o baja de peso rpidamente.  Tiene dificultad para respirar o Midwife.  Sbitamente se le hinchan el rostro, las manos, los tobillos, los pies o las piernas.  No ha sentido los movimientos del beb durante Georgianne Fick.  Siente un dolor de cabeza intenso que no se alivia con medicamentos.  Su visin se modifica. Document Released: 09/16/2012 [facility] Patient Information 2014 [place], [place].  Lactancia materna (Breastfeeding) Decidir Museum/gallery exhibitions officer es una de las mejores elecciones que puede hacer por usted y su beb. El cambio hormonal durante el Psychiatrist produce el desarrollo del tejido mamario y [place] la cantidad y el tamao de los conductos galactforos. Estas hormonas tambin permiten  que las protenas, los azcares y las grasas de la sangre produzcan la [employer] materna en las glndulas productoras de [place]. Las hormonas impiden que la leche materna sea liberada antes del nacimiento del beb, adems de impulsar el flujo de leche luego del nacimiento. Una vez que ha comenzado a Museum/gallery exhibitions officer, Conservation officer, nature beb, as Immunologist succin o Theatre manager, pueden estimular la liberacin de [place] de las glndulas productoras de [place].  LOS BENEFICIOS DE AMAMANTAR Para el beb  La primera leche (calostro) ayuda al mejor funcionamiento del sistema digestivo del beb.  La leche tiene anticuerpos que ayudan a Radio producer las infecciones en el beb.  El beb tiene una menor incidencia de asma, alergias y del sndrome de muerte sbita del lactante.  Los nutrientes en la [place] materna son mejores para el beb que la [place] maternizada y estn preparados exclusivamente para cubrir las necesidades del beb.  La leche materna mejora el desarrollo cerebral del beb.  Es menos probable que el beb desarrolle otras enfermedades, como obesidad infantil, asma o diabetes mellitus de tipo 2. Para usted   La lactancia materna favorece el desarrollo de un vnculo muy especial entre la madre y el beb.  Es conveniente. Siempre est disponible a la temperatura correcta y es [place].  La lactancia materna ayuda a quemar caloras y a perder el peso ganado durante el [place].  Favorece la contraccin del tero al tamao que tena antes del embarazo de manera ms rpida y disminuye el sangrado (loquios) despus del parto.  La lactancia materna contribuye a reducir Nurse, adult de desarrollar diabetes mellitus de tipo 2, osteoporosis o cncer de mama o de ovario en el futuro. SIGNOS DE QUE EL BEB EST HAMBRIENTO Primeros signos de [address] de [place].  Se estira.  Mueve la cabeza de un lado a otro.  Mueve la cabeza y abre la boca cuando se le toca la mejilla o la comisura de la boca  (reflejo de bsqueda).  Aumenta las vocalizaciones, tales como sonidos de succin, se relame los labios, emite arrullos, suspiros, o chirridos.  Mueve la [employer] boca.  Se chupa con ganas los dedos o las manos. Signos tardos de Fisher [employer].  Llora de  manera intermitente. Signos de [employer] Corporation signos de hambre extrema requerirn que lo calme y lo consuele antes de que el beb pueda alimentarse adecuadamente. No espere a que se manifiesten los siguientes signos de hambre extrema para comenzar a Museum/gallery exhibitions officer:   Designer, jewellery.  Llanto intenso y fuerte.   Gritos. INFORMACIN BSICA SOBRE LA LACTANCIA MATERNA Iniciacin de la lactancia materna  Encuentre un lugar cmodo para sentarse o acostarse, con un buen respaldo para el cuello y la espalda.  Coloque una almohada o una manta enrollada debajo del beb para acomodarlo a la altura de la mama (si est sentada). Las almohadas para Museum/gallery exhibitions officer se han diseado especialmente a fin de servir de apoyo para los brazos y el beb [employer].  Asegrese de que el abdomen del beb est frente al suyo.  Masajee suavemente la mama. Con las yemas de los dedos, masajee la pared del pecho hacia el pezn en un movimiento circular. Esto estimula el flujo de Sellers. Es posible que Engineer, manufacturing systems este movimiento mientras amamanta si la leche fluye lentamente.  Sostenga la mama con el pulgar por arriba del pezn y los otros 4 dedos por debajo de la mama. Asegrese de que los dedos se encuentren lejos del pezn y de la boca del beb.  Empuje suavemente los labios del beb con el pezn o con el dedo.  Cuando la boca del beb se abra lo suficiente, acrquelo rpidamente a la mama e introduzca todo el pezn y la zona oscura que lo rodea (areola), tanto como sea posible, dentro de la boca del beb.  Debe haber ms areola visible por arriba del labio superior del beb que por debajo del labio inferior.  La lengua del beb debe estar entre  la enca inferior y la [place].  Asegrese de que la boca del beb est en la posicin correcta alrededor del pezn (prendida). Los labios del beb deben crear un sello sobre la mama, doblndose hacia afuera (invertidos).  Es comn que el beb succione durante 2 a 3 minutos para que comience el flujo de [place]. Cmo debe prenderse Es muy importante que le ensee al beb cmo prenderse adecuadamente a la mama. Si el beb no se prende adecuadamente, puede causarle dolor en el pezn y reducir la produccin de [place], y hacer que el beb tenga un escaso aumento de [place]. Adems, si el beb no se prende adecuadamente al pezn, puede tragar aire durante la alimentacin. Esto puede causarle molestias al beb. Hacer eructar al beb al [place] de mama puede ayudarlo a liberar el aire. Sin embargo, ensearle al beb cmo prenderse a la mama adecuadamente es la mejor manera de evitar que se sienta molesto por tragar Oceanographer se alimenta. Signos de que el beb se ha prendido adecuadamente al pezn:   Payton Doughty o succiona de modo silencioso, sin causarle dolor.  Se escucha que traga cada 3 o 4 succiones.   Hay movimientos musculares por arriba y por delante de sus odos al Printmaker. Signos de que el beb no se ha prendido Audiological scientist al pezn:   Hace ruidos de succin o de chasquido mientras se alimenta.  Dolor en el pezn. Si cree que el beb no se prendi correctamente, deslice el dedo en la comisura de la boca y [employer] Corporation las encas del beb para interrumpir la succin. Intente comenzar a amamantar nuevamente. Signos de Fish farm manager Signos del beb:   Disminucin gradual en el nmero de succiones o cese completo de la  succin.  Se duerme.  Relaja el cuerpo.  Retiene una pequea cantidad de Gastonleche en su boca.  Se desprende solo del pecho. Signos que presenta usted:  Las mamas han aumentado la firmeza, el peso y el tamao 1 a 3 horas despus de  Museum/gallery exhibitions officeramamantar.  Estn ms blandas inmediatamente despus de amamantar.  Un aumento del volumen de Arrow[place]leche, y tambin el cambio de su consistencia y color se producen hacia el quinto da de Tour managerlactancia materna.  Los pezones no duelen, ni estn agrietados ni sangran. Signos de que su beb recibe la cantidad de leche suficiente  Moja al menos 3 paales en 24 horas. La orina debe ser clara y de color amarillo plido a los 5 [address]  [address] de [place].  Defeca al menos 3 veces en 24 horas a los [address]  [address] de [address]. La materia fecal debe ser blanda y Bishopamarillenta.  Defeca al menos 3 veces en 24 horas a los [address] das de [address]. La materia fecal debe ser grumosa y [place] Lakesamarillenta.  No registra una prdida de peso mayor del 10% del peso al nacer durante los primeros 3 [address]  [address] de [place].  Aumenta de peso un promedio de 4 a 7onzas (120 a 210ml) por semana despus de los 4 [address]  [address] de vida.  Aumenta de [place] Neckpeso, Norwood Young Americadiariamente, de [place]manera consistente a Glass blower/designerpartir de los 5 [address]  [address] de vida, sin Passenger transport managerregistrar prdida de peso despus de las 2 semanas de vida. Despus de alimentarse, es posible que el beb regurgite una pequea cantidad. Esto es frecuente. FRECUENCIA Y DURACIN DE LA LACTANCIA MATERNA El amamantamiento frecuente la ayudar a producir ms Azerbaijanleche y a Education officer, communityprevenir problemas de Engineer, miningdolor en los pezones e hinchazn en las [place]. Alimente al beb cuando muestre signos de hambre o si siente la necesidad de reducir la congestin de las [place]. Esto se denomina "lactancia a demanda". Evite el uso del chupete mientras trabaja para establecer la lactancia (las primeras 4 a 6 semanas despus del nacimiento del beb). Despus de este perodo, podr ofrecerle un chupete. Las investigaciones demostraron que el uso del chupete durante el primer ao de vida del beb disminuye el riesgo de desarrollar el sndrome de muerte sbita del lactante (SMSL). Permita que el nio se alimente en cada mama todo lo que desee. Contine amamantando al beb hasta que haya terminado de alimentarse.  Cuando el beb se desprende o se queda dormido mientras se est alimentando de la primera mama, ofrzcale la segunda. Debido a que, con frecuencia, los recin Sunoconacidos permanecen somnolientos las primeras semanas de vida, es posible que deba despertar a su beb para alimentarlo. Los horarios de Acupuncturistlactancia varan de un beb a otro. Sin embargo, las siguientes reglas pueden servir como gua para ayudarle a Lawyergarantizar que el beb se alimenta adecuadamente:  Se puede amamantar a los recin nacidos (bebs de 4 semanas o menos de vida) cada 1 a 3 horas.  No deben transcurrir ms de 3 horas durante el da o 5 horas durante la noche sin que se amamante a los recin nacidos.  Debe amamantar al beb 8 veces como mnimo, en un perodo de 24 horas, hasta que comience a introducir slidos en su dieta, a los 6 meses de vida aproximadamente. EXTRACCIN DE Dean Foods CompanyLECHE MATERNA La extraccin y Contractorel almacenamiento de la leche materna le permiten asegurarse de que el beb se alimente exclusivamente de [place] materna, aun en momentos en los que no puede amamantar. Esto tiene especial importancia si debe regresar al Aleen Campitrabajo en el perodo en que an est  amamantando o si no puede estar presente en los momentos en que el beb debe alimentarse. Su asesor en lactancia puede orientarla sobre cunto tiempo es seguro almacenar [place].  El sacaleche es un aparato que le permite extraer leche de la mama a un recipiente estril. Luego, la leche materna extrada puede almacenarse en un refrigerador o freezer. Algunos sacaleches son Birdie Riddle, Delaney Meigs otros son elctricos. Consulte a su asesor en lactancia qu tipo ser ms conveniente para usted. Los sacaleches se pueden comprar, sin embargo, algunos hospitales y grupos de apoyo a la lactancia materna alquilan Sports coach. Un asesor en lactancia puede ensearle cmo extraer W. [employer], en caso de que prefiera no usar un sacaleche.  CMO CUIDAR LAS MAMAS DURANTE  LA LACTANCIA MATERNA Los pezones se secan, agrietan y duelen durante la Tour manager. Las siguientes recomendaciones pueden ayudarle a Pharmacologist las [employer]PP[employer] Partners y sanas:  Careers information officer usar jabn en los pezones.  Use un sostn de soporte. Aunque no son esenciales, las camisetas sin mangas o los sostenes especiales para Museum/gallery exhibitions officer estn diseados para acceder fcilmente a las mamas, para Museum/gallery exhibitions officer sin tener que quitarse todo el sostn o la camiseta. Evite usar sostenes con aro o sostenes muy ajustados.  Seque al aire sus pezones durante 3 a despus de amamantar al beb.  Utilice solo apsitos de Haematologist sostn para Environmental health practitioner las prdidas de [place]. La prdida de un poco de [employer] Group tomas es normal.  Utilice lanolina sobre los pezones luego de Museum/gallery exhibitions officer. La lanolina ayuda a mantener la humedad normal de la piel. Si [place] lanolina pura, no tiene que lavarse los pezones antes de Corporate treasurer al beb. La lanolina pura no es txica para el beb. Adems, puede extraer [employer] algunas gotas de [place] materna y Engineer, maintenance (IT) suavemente esa [employer], para que la [place] se seque al aire. Durante las primeras semanas despus de dar a luz, algunas mujeres pueden experimentar hinchazn en las mamas (congestin [place]). La congestin puede hacer que sienta las mamas pesadas, calientes y sensibles al tacto. El pico de la congestin ocurre dentro de los 3 a 5 das despus del [place]. Las siguientes recomendaciones pueden ayudarle a Paramedic la congestin:  Vace por completo las mamas al [employer]COMM o Environmental health practitioner. Puede aplicar calor hmedo en las mamas (en la ducha o con toallas hmedas para manos) antes de Museum/gallery exhibitions officer o extraer [employer]. Esto aumenta la circulacin y [place] a que la [place]. Si el beb no vaca por completo las [address] cuando lo [address], extraiga la [place] restante despus de que haya finalizado.  Use un sostn ajustado (para amamantar o comn) o camiseta sin mangas  durante 1 o 2 das para indicar al cuerpo que disminuya ligeramente la produccin de [place].  Aplique compresas de hielo [employer], a menos que le resulte demasiado incmodo.  Asegrese de que el beb se encuentre en la posicin correcta mientras lo alimenta. Si la congestin persiste luego de 48 horas o despus de seguir estas recomendaciones, comunquese con su mdico o un Holiday representative. RECOMENDACIONES GENERALES PARA EL CUIDADO DE LA SALUD DURANTE LA LACTANCIA MATERNA  Consuma alimentos saludables. Alterne comidas y colaciones, comiendo 3 de cada Agricultural engineer. Dado que lo que come [employer], es posible que algunas comidas hagan que su beb se vuelva ms irritable de lo habitual. Evite comer este tipo de alimentos, si percibe que afectan de manera negativa al beb.  D[employer]ie Dials, [place]  de fruta y agua para satisfacer su sed (aproximadamente 10 vasos al da).  Descanse con frecuencia, reljese y tome sus vitaminas prenatales para evitar la fatiga, el estrs y la anemia.  Contine con los autocontroles de la mama.  Evite masticar y fumar tabaco.  Evite el consumo de alcohol y drogas. Algunos medicamentos, que pueden ser perjudiciales para el beb, pueden pasar a travs de la [employer]. Es importante que consulte a su mdico antes de Medical sales representative, incluidos todos los medicamentos recetados y de [place], as como los suplementos vitamnicos y herbales. Puede quedar embarazada durante la lactancia. Si desea controlar la natalidad, consulte a su mdico cules son las opciones ms seguras para el beb. SOLICITE ATENCIN MDICA SI:   Usted siente que quiere dejar de Museum/gallery exhibitions officer o se siente frustrada con la lactancia.  Siente dolor en las mamas o en los pezones.  Sus pezones estn agrietados o Water quality scientist.  Sus pechos estn irritados, sensibles o calientes.  Tiene un rea hinchada en cualquiera de las mamas.  Siente escalofros o fiebre.  Tiene  nuseas o vmitos.  Presenta una secrecin de otro lquido distinto de la leche materna de los pezones.  Sus mamas no se llenan antes de Museum/gallery exhibitions officer al beb para el 5. da despus del parto.  Se siente triste y deprimida.  El beb est demasiado somnoliento como para comer bien.  El beb tiene problemas para dormir.  Moja menos de 3 paales en 24 horas.  Defeca menos de 3 veces en 24 horas.  La piel del beb o la parte blanca de sus ojos est amarilla.  El beb no ha aumentado de [place] a los [address] de [place]. SOLICITE ATENCIN MDICA DE INMEDIATO SI:   El beb est muy cansado (aletargado) y no se despierta para comer.  Le sube la fiebre sin causa. Document Released: 01/14/2005 Document Revised: 05/11/2012 [facility] Patient Information 2014 [place], [place].

## 2013-02-24 NOTE — Progress Notes (Signed)
Pulse- 101 

## 2013-02-24 NOTE — Progress Notes (Signed)
Tdap today. 28 week labs.

## 2013-02-25 LAB — GLUCOSE TOLERANCE, 1 HOUR (50G) W/O FASTING: Glucose, 1 Hour GTT: 94 mg/dL (ref 70–140)

## 2013-02-25 LAB — HIV ANTIBODY (ROUTINE TESTING W REFLEX): HIV: NONREACTIVE

## 2013-02-25 LAB — RPR

## 2013-03-04 ENCOUNTER — Encounter: Payer: Self-pay | Admitting: Advanced Practice Midwife

## 2013-03-10 ENCOUNTER — Ambulatory Visit (INDEPENDENT_AMBULATORY_CARE_PROVIDER_SITE_OTHER): Payer: Medicaid Other | Admitting: Obstetrics and Gynecology

## 2013-03-10 VITALS — BP 99/56 | Wt 149.5 lb

## 2013-03-10 DIAGNOSIS — Z3480 Encounter for supervision of other normal pregnancy, unspecified trimester: Secondary | ICD-10-CM

## 2013-03-10 DIAGNOSIS — Z3490 Encounter for supervision of normal pregnancy, unspecified, unspecified trimester: Secondary | ICD-10-CM

## 2013-03-10 LAB — POCT URINALYSIS DIP (DEVICE)
BILIRUBIN URINE: NEGATIVE
GLUCOSE, UA: NEGATIVE mg/dL
Hgb urine dipstick: NEGATIVE
KETONES UR: NEGATIVE mg/dL
LEUKOCYTES UA: NEGATIVE
Nitrite: NEGATIVE
PH: 7 (ref 5.0–8.0)
Protein, ur: NEGATIVE mg/dL
Specific Gravity, Urine: 1.025 (ref 1.005–1.030)
Urobilinogen, UA: 0.2 mg/dL (ref 0.0–1.0)

## 2013-03-10 LAB — CBC
HEMATOCRIT: 32.4 % — AB (ref 36.0–46.0)
HEMOGLOBIN: 10.9 g/dL — AB (ref 12.0–15.0)
MCH: 30.4 pg (ref 26.0–34.0)
MCHC: 33.6 g/dL (ref 30.0–36.0)
MCV: 90.3 fL (ref 78.0–100.0)
Platelets: 222 10*3/uL (ref 150–400)
RBC: 3.59 MIL/uL — AB (ref 3.87–5.11)
RDW: 14.5 % (ref 11.5–15.5)
WBC: 8.1 10*3/uL (ref 4.0–10.5)

## 2013-03-10 NOTE — Progress Notes (Signed)
RLP still. Also having nosebleeds. CBC today (was not done with glucola). Discussed RLP and nosebleeds. Stands a lot at [employer].

## 2013-03-10 NOTE — Patient Instructions (Signed)
Tercer trimestre del embarazo  (Third Trimester of Pregnancy) El tercer trimestre del embarazo abarca desde la semana 29 hasta la semana 42, desde el 7 mes hasta el 9. En este trimestre el feto se desarrolla muy rpidamente. Hacia el final del noveno mes, el beb que an no ha nacido mide alrededor de 20 pulgadas (45 cm) de largo y pesa entre 6 y 10 libras (2,700 y 4,500 kg).  CAMBIOS CORPORALES  Su organismo atravesar numerosos cambios durante el embarazo. Los cambios varan de una mujer a otra.   Seguir aumentando de peso. Es esperable que aumente entre 25 y 35 libras (11 16 kg) hacia el final del embarazo.  Podrn aparecer las primeras estras en las caderas, abdomen y mamas.  Tendr necesidad de orinar con ms frecuencia porque el feto baja hacia la pelvis y presiona en la vejiga.  Como consecuencia del embarazo, podr sentir acidez estomacal continuamente.  Podr estar constipada ya que ciertas hormonas hacen que los msculos que hacen progresar los desechos a travs de los intestinos trabajen ms lentamente.  Pueden aparecer hemorroides o abultarse e hincharse las venas (venas varicosas).  Podr sentir dolor plvico debido al aumento de peso ya que las hormonas del embarazo relajan las articulaciones entre los huesos de la pelvis. El dolor de espalda puede ser consecuencia de la exigencia de los msculos que soportan la postura.  Sus mamas seguirn desarrollndose y estarn ms sensibles. A veces sale una secrecin amarilla de las mamas, que se llama calostro.  El ombligo puede salir hacia afuera.  Podr sentir que le falta el aire debido a que se expande el tero.  Podr notar que el feto "baja" o que se siente ms bajo en el abdomen.  Podr tener una prdida de secrecin mucosa con sangre. Esto suele ocurrir entre unos pocos das y una semana antes del parto.  El cuello se vuelve delgado y blando (se borra) cerca de la fecha de parto. QU DEBE ESPERAR EN LAS CONSULTAS  PRENATALES  Le harn exmenes prenatales cada 2 semanas hasta la semana 36. A partir de ese momento le harn exmenes semanales. Durante una visita prenatal de rutina:   La pesarn para verificar que usted y el feto se encuentran dentro de los lmites normales.  Le tomarn la presin arterial.  Le medirn el abdomen para verificar el desarrollo del beb.  Escucharn los latidos fetales.  Se evaluarn los resultados de los estudios solicitados en visitas anteriores.  Le controlarn el cuello del tero cuando est prxima la fecha de parto para ver si se ha borrado. Alrededor de la semana 36 el mdico controlar el cuello del tero. Al mismo tiempo realizar un anlisis de las secreciones del tejido vaginal. Este examen es para determinar si hay un tipo de bacteria, estreptococo Grupo B. El mdico le explicar esto con ms detalle.  El mdico podr preguntarle:   Como le gustara que fuera el parto.  Cmo se siente.  Si siente los movimientos del beb.  Si tiene sntomas anormales, como prdida de lquido, sangrado, dolores de cabeza intenso o clicos abdominales.  Si tiene alguna duda. Otros estudios que podrn realizarse durante el tercer trimestre son:   Anlisis de sangre para controlar sus niveles de hierro (anemia).  Controles fetales para determinar su salud, el nivel de actividad y su desarrollo. Si tiene alguna enfermedad o si tuvo problemas durante el embarazo, le harn estudios. FALSO TRABAJO DE PARTO  Es posible que sienta contracciones pequeas e irregulares que finalmente   desaparecen. Se llaman contracciones de Braxton Hicks o falso trabajo de parto. Las contracciones pueden durar horas, das o an semanas antes de que el verdadero trabajo de parto se inicie. Si las contracciones tienen intervalos regulares, se intensifican o se hacen dolorosas, lo mejor es que la revise su mdico.  SIGNOS DE TRABAJO DE PARTO   Espasmos del tipo menstrual.  Contracciones cada 5  minutos o menos.  Contracciones que comienzan en la parte superior del tero y se expanden hacia abajo, a la zona inferior del abdomen y la espalda.  Sensacin de presin que aumenta en la pelvis o dolor en la espalda.  Aparece una secrecin acuosa o sanguinolenta por la vagina. Si tiene alguno de estos signos antes de la semana 37 del embarazo, llame a su mdico inmediatamente. Debe concurrir al hospital para ser controlada inmediatamente.  INSTRUCCIONES PARA EL CUIDADO EN EL HOGAR   Evite fumar, consumir hierbas, beber alcohol y utilizar frmacos que no le hayan recetado. Estas sustancias qumicas afectan la formacin y el desarrollo del beb.  Siga las indicaciones del profesional con respecto a como tomar los medicamentos. Durante el embarazo, hay medicamentos que son seguros y otros no lo son.  Realice actividad fsica slo segn las indicaciones del mdico. Sentir clicos uterinos es el mejor signo para detener la actividad fsica.  Contine haciendo comidas regulares y sanas.  Use un sostn que le brinde buen soporte si sus mamas estn sensibles.  No utilice la baera con agua caliente, baos turcos o saunas.  Colquese el cinturn de seguridad cuando conduzca.  Evite comer carne cruda queso sin cocinar y el contacto con los utensilios y desperdicios de los gatos. Estos elementos contienen grmenes que pueden causar defectos de nacimiento en el beb.  Tome las vitaminas indicadas para la etapa prenatal.  Pruebe un laxante (si el mdico la autoriza) si tiene constipacin. Consuma ms alimentos ricos en fibra, como vegetales y frutas frescos y cereales enteros. Beba gran cantidad de lquido para mantener la orina de tono claro o amarillo plido.  Tome baos de agua tibia para calmar el dolor o las molestias causadas por las hemorroides. Use una crema para las hemorroides si el mdico la autoriza.  Si tiene venas varicosas, use medias de soporte. Eleve los pies durante 15 minutos,  3 4 veces por da. Limite el consumo de sal en su dieta.  Evite levantar objetos pesados, use zapatos de tacones bajos y mantenga una buena postura.  Descanse con las piernas elevadas si tiene calambres o dolor de cintura.  Visite a su dentista si no lo ha hecho durante el embarazo. Use un cepillo de dientes blando para higienizarse los dientes y use suavemente el hilo dental.  Puede continuar su vida sexual excepto que el mdico le indique otra cosa.  No haga viajes largos excepto que sea absolutamente necesario y slo con la aprobacin de su mdico.  Tome clases prenatales para entender, practicar y hacer preguntas sobre el trabajo de parto y el alumbramiento.  Haga un ensayo sobre la partida al hospital.  Prepare el bolso que llevar al hospital.  Prepare la habitacin del beb.  Contine concurriendo a todas las visitas prenatales segn las indicaciones de su mdico. SOLICITE ATENCIN MDICA SI:   No est segura si est en trabajo de parto o ha roto la bolsa de aguas.  Tiene mareos.  Siente clicos leves, presin en la pelvis o dolor persistente en el abdomen.  Tiene nuseas o vmitos persistentes.  Observa una   secrecin vaginal con mal olor.  Siente dolor al orinar. SOLICITE ATENCIN MDICA DE INMEDIATO SI:   Tiene fiebre.  Pierde lquido o sangre por la vagina.  Tiene sangrado o pequeas prdidas vaginales.  Siente dolor intenso o clicos en el abdomen.  Sube o baja de peso rpidamente.  Le falta el aire y le duele el pecho al respirar.  Sbitamente se le hincha el rostro, las manos, los tobillos, los pies o las piernas de manera extrema.  No ha sentido los movimientos del beb durante una hora.  Siente un dolor de cabeza intenso que no se alivia con medicamentos.  Su visin se modifica. Document Released: 10/24/2004 Document Revised: 09/16/2012 Exit[employer] Patient Information 2014 [employer].  

## 2013-03-10 NOTE — Progress Notes (Signed)
Pulse: 102

## 2013-03-11 LAB — RPR

## 2013-03-24 ENCOUNTER — Encounter: Payer: Self-pay | Admitting: Family Medicine

## 2013-03-24 ENCOUNTER — Ambulatory Visit (INDEPENDENT_AMBULATORY_CARE_PROVIDER_SITE_OTHER): Payer: Medicaid Other | Admitting: Family Medicine

## 2013-03-24 VITALS — BP 111/69 | Temp 96.7°F | Wt 150.3 lb

## 2013-03-24 DIAGNOSIS — Z3480 Encounter for supervision of other normal pregnancy, unspecified trimester: Secondary | ICD-10-CM

## 2013-03-24 DIAGNOSIS — Z3490 Encounter for supervision of normal pregnancy, unspecified, unspecified trimester: Secondary | ICD-10-CM

## 2013-03-24 LAB — POCT URINALYSIS DIP (DEVICE)
BILIRUBIN URINE: NEGATIVE
GLUCOSE, UA: 100 mg/dL — AB
HGB URINE DIPSTICK: NEGATIVE
KETONES UR: NEGATIVE mg/dL
Leukocytes, UA: NEGATIVE
Nitrite: NEGATIVE
Protein, ur: 30 mg/dL — AB
Urobilinogen, UA: 0.2 mg/dL (ref 0.0–1.0)
pH: 6 (ref 5.0–8.0)

## 2013-03-24 NOTE — Patient Instructions (Signed)
Tercer trimestre del embarazo  (Third Trimester of Pregnancy) El tercer trimestre del embarazo abarca desde la semana 29 hasta la semana 42, desde el 7 mes hasta el 9. En este trimestre el feto se desarrolla muy rpidamente. Hacia el final del noveno mes, el beb que an no ha nacido mide alrededor de 20 pulgadas (45 cm) de largo y pesa entre 6 y 10 libras (2,700 y 4,500 kg).  CAMBIOS CORPORALES  Su organismo atravesar numerosos cambios durante el embarazo. Los cambios varan de una mujer a otra.   Seguir aumentando de peso. Es esperable que aumente entre 25 y 35 libras (11 16 kg) hacia el final del embarazo.  Podrn aparecer las primeras estras en las caderas, abdomen y mamas.  Tendr necesidad de orinar con ms frecuencia porque el feto baja hacia la pelvis y presiona en la vejiga.  Como consecuencia del embarazo, podr sentir acidez estomacal continuamente.  Podr estar constipada ya que ciertas hormonas hacen que los msculos que hacen progresar los desechos a travs de los intestinos trabajen ms lentamente.  Pueden aparecer hemorroides o abultarse e hincharse las venas (venas varicosas).  Podr sentir dolor plvico debido al aumento de peso ya que las hormonas del embarazo relajan las articulaciones entre los huesos de la pelvis. El dolor de espalda puede ser consecuencia de la exigencia de los msculos que soportan la postura.  Sus mamas seguirn desarrollndose y estarn ms sensibles. A veces sale una secrecin amarilla de las mamas, que se llama calostro.  El ombligo puede salir hacia afuera.  Podr sentir que le falta el aire debido a que se expande el tero.  Podr notar que el feto "baja" o que se siente ms bajo en el abdomen.  Podr tener una prdida de secrecin mucosa con sangre. Esto suele ocurrir entre unos pocos das y una semana antes del parto.  El cuello se vuelve delgado y blando (se borra) cerca de la fecha de parto. QU DEBE ESPERAR EN LAS CONSULTAS  PRENATALES  Le harn exmenes prenatales cada 2 semanas hasta la semana 36. A partir de ese momento le harn exmenes semanales. Durante una visita prenatal de rutina:   La pesarn para verificar que usted y el feto se encuentran dentro de los lmites normales.  Le tomarn la presin arterial.  Le medirn el abdomen para verificar el desarrollo del beb.  Escucharn los latidos fetales.  Se evaluarn los resultados de los estudios solicitados en visitas anteriores.  Le controlarn el cuello del tero cuando est prxima la fecha de parto para ver si se ha borrado. Alrededor de la semana 36 el mdico controlar el cuello del tero. Al mismo tiempo realizar un anlisis de las secreciones del tejido vaginal. Este examen es para determinar si hay un tipo de bacteria, estreptococo Grupo B. El mdico le explicar esto con ms detalle.  El mdico podr preguntarle:   Como le gustara que fuera el parto.  Cmo se siente.  Si siente los movimientos del beb.  Si tiene sntomas anormales, como prdida de lquido, sangrado, dolores de cabeza intenso o clicos abdominales.  Si tiene alguna duda. Otros estudios que podrn realizarse durante el tercer trimestre son:   Anlisis de sangre para controlar sus niveles de hierro (anemia).  Controles fetales para determinar su salud, el nivel de actividad y su desarrollo. Si tiene alguna enfermedad o si tuvo problemas durante el embarazo, le harn estudios. FALSO TRABAJO DE PARTO  Es posible que sienta contracciones pequeas e irregulares que finalmente   desaparecen. Se llaman contracciones de Braxton Hicks o falso trabajo de parto. Las contracciones pueden durar horas, das o an semanas antes de que el verdadero trabajo de parto se inicie. Si las contracciones tienen intervalos regulares, se intensifican o se hacen dolorosas, lo mejor es que la revise su mdico.  SIGNOS DE TRABAJO DE PARTO   Espasmos del tipo menstrual.  Contracciones cada 5  minutos o menos.  Contracciones que comienzan en la parte superior del tero y se expanden hacia abajo, a la zona inferior del abdomen y la espalda.  Sensacin de presin que aumenta en la pelvis o dolor en la espalda.  Aparece una secrecin acuosa o sanguinolenta por la vagina. Si tiene alguno de estos signos antes de la semana 37 del embarazo, llame a su mdico inmediatamente. Debe concurrir al hospital para ser controlada inmediatamente.  INSTRUCCIONES PARA EL CUIDADO EN EL HOGAR   Evite fumar, consumir hierbas, beber alcohol y utilizar frmacos que no le hayan recetado. Estas sustancias qumicas afectan la formacin y el desarrollo del beb.  Siga las indicaciones del profesional con respecto a como tomar los medicamentos. Durante el embarazo, hay medicamentos que son seguros y otros no lo son.  Realice actividad fsica slo segn las indicaciones del mdico. Sentir clicos uterinos es el mejor signo para detener la actividad fsica.  Contine haciendo comidas regulares y sanas.  Use un sostn que le brinde buen soporte si sus mamas estn sensibles.  No utilice la baera con agua caliente, baos turcos o saunas.  Colquese el cinturn de seguridad cuando conduzca.  Evite comer carne cruda queso sin cocinar y el contacto con los utensilios y desperdicios de los gatos. Estos elementos contienen grmenes que pueden causar defectos de nacimiento en el beb.  Tome las vitaminas indicadas para la etapa prenatal.  Pruebe un laxante (si el mdico la autoriza) si tiene constipacin. Consuma ms alimentos ricos en fibra, como vegetales y frutas frescos y cereales enteros. Beba gran cantidad de lquido para mantener la orina de tono claro o amarillo plido.  Tome baos de agua tibia para calmar el dolor o las molestias causadas por las hemorroides. Use una crema para las hemorroides si el mdico la autoriza.  Si tiene venas varicosas, use medias de soporte. Eleve los pies durante 15 minutos,  3 4 veces por da. Limite el consumo de sal en su dieta.  Evite levantar objetos pesados, use zapatos de tacones bajos y mantenga una buena postura.  Descanse con las piernas elevadas si tiene calambres o dolor de cintura.  Visite a su dentista si no lo ha hecho durante el embarazo. Use un cepillo de dientes blando para higienizarse los dientes y use suavemente el hilo dental.  Puede continuar su vida sexual excepto que el mdico le indique otra cosa.  No haga viajes largos excepto que sea absolutamente necesario y slo con la aprobacin de su mdico.  Tome clases prenatales para entender, practicar y hacer preguntas sobre el trabajo de parto y el alumbramiento.  Haga un ensayo sobre la partida al hospital.  Prepare el bolso que llevar al hospital.  Prepare la habitacin del beb.  Contine concurriendo a todas las visitas prenatales segn las indicaciones de su mdico. SOLICITE ATENCIN MDICA SI:   No est segura si est en trabajo de parto o ha roto la bolsa de aguas.  Tiene mareos.  Siente clicos leves, presin en la pelvis o dolor persistente en el abdomen.  Tiene nuseas o vmitos persistentes.  Observa una   secrecin vaginal con mal olor.  Siente dolor al orinar. SOLICITE ATENCIN MDICA DE INMEDIATO SI:   Tiene fiebre.  Pierde lquido o sangre por la vagina.  Tiene sangrado o pequeas prdidas vaginales.  Siente dolor intenso o clicos en el abdomen.  Sube o baja de peso rpidamente.  Le falta el aire y le duele el pecho al respirar.  Sbitamente se le hincha el rostro, las manos, los tobillos, los pies o las piernas de manera extrema.  No ha sentido los movimientos del beb durante una hora.  Siente un dolor de cabeza intenso que no se alivia con medicamentos.  Su visin se modifica. Document Released: 10/24/2004 Document Revised: 09/16/2012 Exit[employer] Patient Information 2014 [employer].  

## 2013-03-24 NOTE — Progress Notes (Signed)
Sx consistent with carpal tunnel recommend braces +FM, no lof , no vb, no ctx  Janice Neal is a 33 y.o. G3P1011 at 3027w1d here for ROB visit.  Discussed with Patient:  -Plans to breast feed.  All questions answered. -Continue prenatal vitamins. -Reviewed fetal kick counts Pt to perform daily at a time when the baby is active, lie laterally with both hands on belly in quiet room and count all movements (hiccups, shoulder rolls, obvious kicks, etc); pt is to report to clinic L&D for less than 10 movements felt in a one hour time period-pt told as soon as she counts 10 movements the count is complete.  - Routine precautions discussed (depression, infection s/s).   Patient provided with all pertinent phone numbers for emergencies. - RTC for any VB, regular, painful cramps/ctxs occurring at a rate of >2/10 min, fever (100.5 or higher), n/v/d, any pain that is unresolving or worsening, LOF, decreased fetal movement, CP, SOB, edema - RTC in 2 weeks for next appt.   Problems: Patient Active Problem List   Diagnosis Date Noted  . Supervision of normal pregnancy 10/27/2012  . Language barrier, speaks Spanish but can speak adequate English. 10/27/2012  . Varicose veins 10/30/2011    To Do:  [ ]  Vaccines: recd[ ]  BCM: paragarrd [ ]  Readiness: baby has a place to sleep, car seat, other baby necessities.  Edu: [x ] PTL precautions; [ ]  BF class; [ ]  childbirth class; [ ]   BF counseling;

## 2013-03-24 NOTE — Progress Notes (Signed)
P=99 [employer] Interpreter used, [place] [id number].  Mild edema in legs in the afternoon. C/o of Cramping in bilateral arms and hands.  C/o of continuing nose bleeds, states she goes to bed at night stuffed and wakes up with a nose bleed. Pt. States she does have a humidifier.

## 2013-04-07 ENCOUNTER — Ambulatory Visit (INDEPENDENT_AMBULATORY_CARE_PROVIDER_SITE_OTHER): Payer: Medicaid Other | Admitting: Obstetrics and Gynecology

## 2013-04-07 VITALS — BP 108/67 | Temp 97.7°F | Wt 151.5 lb

## 2013-04-07 DIAGNOSIS — Z3480 Encounter for supervision of other normal pregnancy, unspecified trimester: Secondary | ICD-10-CM

## 2013-04-07 DIAGNOSIS — Z3490 Encounter for supervision of normal pregnancy, unspecified, unspecified trimester: Secondary | ICD-10-CM

## 2013-04-07 LAB — POCT URINALYSIS DIP (DEVICE)
BILIRUBIN URINE: NEGATIVE
GLUCOSE, UA: NEGATIVE mg/dL
KETONES UR: NEGATIVE mg/dL
Leukocytes, UA: NEGATIVE
Nitrite: NEGATIVE
PH: 6 (ref 5.0–8.0)
Protein, ur: NEGATIVE mg/dL
SPECIFIC GRAVITY, URINE: 1.025 (ref 1.005–1.030)
Urobilinogen, UA: 0.2 mg/dL (ref 0.0–1.0)

## 2013-04-07 NOTE — Progress Notes (Signed)
Doing well. Cultures next. Mild RLP sx and CTS sx, not using braces. Denies contractions. Fetus active. No further nosebleeds

## 2013-04-07 NOTE — Progress Notes (Signed)
P= 101 Edema in hands and face.  C/o of intermittent lower abdominal/pelvic pressure and low back pain especially late in the day.

## 2013-04-07 NOTE — Patient Instructions (Signed)
Tercer trimestre del embarazo  (Third Trimester of Pregnancy) El tercer trimestre del embarazo abarca desde la semana 29 hasta la semana 42, desde el 7 mes hasta el 9. En este trimestre el feto se desarrolla muy rpidamente. Hacia el final del noveno mes, el beb que an no ha nacido mide alrededor de 20 pulgadas (45 cm) de largo y pesa entre 6 y 10 libras (2,700 y 4,500 kg).  CAMBIOS CORPORALES  Su organismo atravesar numerosos cambios durante el embarazo. Los cambios varan de una mujer a otra.   Seguir aumentando de peso. Es esperable que aumente entre 25 y 35 libras (11 16 kg) hacia el final del embarazo.  Podrn aparecer las primeras estras en las caderas, abdomen y mamas.  Tendr necesidad de orinar con ms frecuencia porque el feto baja hacia la pelvis y presiona en la vejiga.  Como consecuencia del embarazo, podr sentir acidez estomacal continuamente.  Podr estar constipada ya que ciertas hormonas hacen que los msculos que hacen progresar los desechos a travs de los intestinos trabajen ms lentamente.  Pueden aparecer hemorroides o abultarse e hincharse las venas (venas varicosas).  Podr sentir dolor plvico debido al aumento de peso ya que las hormonas del embarazo relajan las articulaciones entre los huesos de la pelvis. El dolor de espalda puede ser consecuencia de la exigencia de los msculos que soportan la postura.  Sus mamas seguirn desarrollndose y estarn ms sensibles. A veces sale una secrecin amarilla de las mamas, que se llama calostro.  El ombligo puede salir hacia afuera.  Podr sentir que le falta el aire debido a que se expande el tero.  Podr notar que el feto "baja" o que se siente ms bajo en el abdomen.  Podr tener una prdida de secrecin mucosa con sangre. Esto suele ocurrir entre unos pocos das y una semana antes del parto.  El cuello se vuelve delgado y blando (se borra) cerca de la fecha de parto. QU DEBE ESPERAR EN LAS CONSULTAS  PRENATALES  Le harn exmenes prenatales cada 2 semanas hasta la semana 36. A partir de ese momento le harn exmenes semanales. Durante una visita prenatal de rutina:   La pesarn para verificar que usted y el feto se encuentran dentro de los lmites normales.  Le tomarn la presin arterial.  Le medirn el abdomen para verificar el desarrollo del beb.  Escucharn los latidos fetales.  Se evaluarn los resultados de los estudios solicitados en visitas anteriores.  Le controlarn el cuello del tero cuando est prxima la fecha de parto para ver si se ha borrado. Alrededor de la semana 36 el mdico controlar el cuello del tero. Al mismo tiempo realizar un anlisis de las secreciones del tejido vaginal. Este examen es para determinar si hay un tipo de bacteria, estreptococo Grupo B. El mdico le explicar esto con ms detalle.  El mdico podr preguntarle:   Como le gustara que fuera el parto.  Cmo se siente.  Si siente los movimientos del beb.  Si tiene sntomas anormales, como prdida de lquido, sangrado, dolores de cabeza intenso o clicos abdominales.  Si tiene alguna duda. Otros estudios que podrn realizarse durante el tercer trimestre son:   Anlisis de sangre para controlar sus niveles de hierro (anemia).  Controles fetales para determinar su salud, el nivel de actividad y su desarrollo. Si tiene alguna enfermedad o si tuvo problemas durante el embarazo, le harn estudios. FALSO TRABAJO DE PARTO  Es posible que sienta contracciones pequeas e irregulares que finalmente   desaparecen. Se llaman contracciones de Braxton Hicks o falso trabajo de parto. Las contracciones pueden durar horas, das o an semanas antes de que el verdadero trabajo de parto se inicie. Si las contracciones tienen intervalos regulares, se intensifican o se hacen dolorosas, lo mejor es que la revise su mdico.  SIGNOS DE TRABAJO DE PARTO   Espasmos del tipo menstrual.  Contracciones cada 5  minutos o menos.  Contracciones que comienzan en la parte superior del tero y se expanden hacia abajo, a la zona inferior del abdomen y la espalda.  Sensacin de presin que aumenta en la pelvis o dolor en la espalda.  Aparece una secrecin acuosa o sanguinolenta por la vagina. Si tiene alguno de estos signos antes de la semana 37 del embarazo, llame a su mdico inmediatamente. Debe concurrir al hospital para ser controlada inmediatamente.  INSTRUCCIONES PARA EL CUIDADO EN EL HOGAR   Evite fumar, consumir hierbas, beber alcohol y utilizar frmacos que no le hayan recetado. Estas sustancias qumicas afectan la formacin y el desarrollo del beb.  Siga las indicaciones del profesional con respecto a como tomar los medicamentos. Durante el embarazo, hay medicamentos que son seguros y otros no lo son.  Realice actividad fsica slo segn las indicaciones del mdico. Sentir clicos uterinos es el mejor signo para detener la actividad fsica.  Contine haciendo comidas regulares y sanas.  Use un sostn que le brinde buen soporte si sus mamas estn sensibles.  No utilice la baera con agua caliente, baos turcos o saunas.  Colquese el cinturn de seguridad cuando conduzca.  Evite comer carne cruda queso sin cocinar y el contacto con los utensilios y desperdicios de los gatos. Estos elementos contienen grmenes que pueden causar defectos de nacimiento en el beb.  Tome las vitaminas indicadas para la etapa prenatal.  Pruebe un laxante (si el mdico la autoriza) si tiene constipacin. Consuma ms alimentos ricos en fibra, como vegetales y frutas frescos y cereales enteros. Beba gran cantidad de lquido para mantener la orina de tono claro o amarillo plido.  Tome baos de agua tibia para calmar el dolor o las molestias causadas por las hemorroides. Use una crema para las hemorroides si el mdico la autoriza.  Si tiene venas varicosas, use medias de soporte. Eleve los pies durante 15 minutos,  3 4 veces por da. Limite el consumo de sal en su dieta.  Evite levantar objetos pesados, use zapatos de tacones bajos y mantenga una buena postura.  Descanse con las piernas elevadas si tiene calambres o dolor de cintura.  Visite a su dentista si no lo ha hecho durante el embarazo. Use un cepillo de dientes blando para higienizarse los dientes y use suavemente el hilo dental.  Puede continuar su vida sexual excepto que el mdico le indique otra cosa.  No haga viajes largos excepto que sea absolutamente necesario y slo con la aprobacin de su mdico.  Tome clases prenatales para entender, practicar y hacer preguntas sobre el trabajo de parto y el alumbramiento.  Haga un ensayo sobre la partida al hospital.  Prepare el bolso que llevar al hospital.  Prepare la habitacin del beb.  Contine concurriendo a todas las visitas prenatales segn las indicaciones de su mdico. SOLICITE ATENCIN MDICA SI:   No est segura si est en trabajo de parto o ha roto la bolsa de aguas.  Tiene mareos.  Siente clicos leves, presin en la pelvis o dolor persistente en el abdomen.  Tiene nuseas o vmitos persistentes.  Observa una   secrecin vaginal con mal olor.  Siente dolor al orinar. SOLICITE ATENCIN MDICA DE INMEDIATO SI:   Tiene fiebre.  Pierde lquido o sangre por la vagina.  Tiene sangrado o pequeas prdidas vaginales.  Siente dolor intenso o clicos en el abdomen.  Sube o baja de peso rpidamente.  Le falta el aire y le duele el pecho al respirar.  Sbitamente se le hincha el rostro, las manos, los tobillos, los pies o las piernas de manera extrema.  No ha sentido los movimientos del beb durante una hora.  Siente un dolor de cabeza intenso que no se alivia con medicamentos.  Su visin se modifica. Document Released: 10/24/2004 Document Revised: 09/16/2012 Exit[employer] Patient Information 2014 [employer].  

## 2013-04-21 ENCOUNTER — Ambulatory Visit (INDEPENDENT_AMBULATORY_CARE_PROVIDER_SITE_OTHER): Payer: Medicaid Other | Admitting: Family Medicine

## 2013-04-21 VITALS — BP 104/62 | Temp 97.6°F | Wt 153.1 lb

## 2013-04-21 DIAGNOSIS — Z789 Other specified health status: Secondary | ICD-10-CM

## 2013-04-21 DIAGNOSIS — Z609 Problem related to social environment, unspecified: Secondary | ICD-10-CM

## 2013-04-21 DIAGNOSIS — Z3480 Encounter for supervision of other normal pregnancy, unspecified trimester: Secondary | ICD-10-CM

## 2013-04-21 DIAGNOSIS — Z3490 Encounter for supervision of normal pregnancy, unspecified, unspecified trimester: Secondary | ICD-10-CM

## 2013-04-21 DIAGNOSIS — Z603 Acculturation difficulty: Secondary | ICD-10-CM

## 2013-04-21 LAB — POCT URINALYSIS DIP (DEVICE)
Bilirubin Urine: NEGATIVE
GLUCOSE, UA: NEGATIVE mg/dL
KETONES UR: NEGATIVE mg/dL
LEUKOCYTES UA: NEGATIVE
Nitrite: NEGATIVE
Protein, ur: NEGATIVE mg/dL
SPECIFIC GRAVITY, URINE: 1.02 (ref 1.005–1.030)
UROBILINOGEN UA: 0.2 mg/dL (ref 0.0–1.0)
pH: 7 (ref 5.0–8.0)

## 2013-04-21 LAB — OB RESULTS CONSOLE GBS: STREP GROUP B AG: NEGATIVE

## 2013-04-21 LAB — OB RESULTS CONSOLE GC/CHLAMYDIA
Chlamydia: NEGATIVE
GC PROBE AMP, GENITAL: NEGATIVE

## 2013-04-21 NOTE — Progress Notes (Signed)
P= 98 C/o of intermittent lower abdominal/pelvic pain that radiates down left leg.

## 2013-04-21 NOTE — Progress Notes (Signed)
S: 33 yo G3P1011 @ 8166w1d here for ROBV - doing well - having some pelvic pain at the end of a long day - no ctx, lof,vb. +FM  O: see flowsheet  A/P - appears to be transverse on leopolds - US today to confirm presentation showing vertex and story of baby rotating per mom while waiting.  - cultures done F/u in 1 week.

## 2013-04-21 NOTE — Progress Notes (Signed)
Bedside US for presentation - vertex.  Dr. Reola CalkinsBeck notified

## 2013-04-22 LAB — GC/CHLAMYDIA PROBE AMP
CT PROBE, AMP APTIMA: NEGATIVE
GC PROBE AMP APTIMA: NEGATIVE

## 2013-04-24 LAB — CULTURE, STREPTOCOCCUS GRP B W/SUSCEPT

## 2013-04-29 ENCOUNTER — Ambulatory Visit (INDEPENDENT_AMBULATORY_CARE_PROVIDER_SITE_OTHER): Payer: Medicaid Other | Admitting: Obstetrics and Gynecology

## 2013-04-29 ENCOUNTER — Encounter: Payer: Self-pay | Admitting: Obstetrics and Gynecology

## 2013-04-29 VITALS — BP 119/83 | Wt 156.5 lb

## 2013-04-29 DIAGNOSIS — Z3480 Encounter for supervision of other normal pregnancy, unspecified trimester: Secondary | ICD-10-CM

## 2013-04-29 DIAGNOSIS — Z3490 Encounter for supervision of normal pregnancy, unspecified, unspecified trimester: Secondary | ICD-10-CM

## 2013-04-29 LAB — POCT URINALYSIS DIP (DEVICE)
BILIRUBIN URINE: NEGATIVE
GLUCOSE, UA: NEGATIVE mg/dL
HGB URINE DIPSTICK: NEGATIVE
Ketones, ur: NEGATIVE mg/dL
LEUKOCYTES UA: NEGATIVE
NITRITE: NEGATIVE
PH: 6.5 (ref 5.0–8.0)
Protein, ur: NEGATIVE mg/dL
Specific Gravity, Urine: 1.02 (ref 1.005–1.030)
UROBILINOGEN UA: 0.2 mg/dL (ref 0.0–1.0)

## 2013-04-29 NOTE — Progress Notes (Signed)
No show seen on exam. Describes pelvic pressure and LBP, worse with walkiing. RLP discussed. GBS neg Spanish interpreter used.

## 2013-04-29 NOTE — Progress Notes (Signed)
Pulse: 100 Pt reports brown discharge.

## 2013-04-29 NOTE — Patient Instructions (Signed)
Parto vaginal (Vaginal Delivery) Durante el parto, el mdico la ayudar a dar a luz a su beb. En el parto vaginal, deber pujar para que el beb salga por la vagina. Sin embargo, antes de que pueda sacar al beb, es necesario que ocurran ciertas cosas. La abertura del tero (cuello del tero) tiene que ablandarse, hacerse ms delgado y abrirse (dilatar) hasta que llegue a 10 cm. Adems, el beb tiene que bajar desde el tero a la vagina.  SIGNOS DE TRABAJO DE PARTO  El mdico tendr primero que asegurarse de que usted est en trabajo de parto. Algunos signos son:   Eliminar lo que se llama tapn mucoso antes del inicio del trabajo de parto. Este es una pequea cantidad de mucosidad teida con sangre.  Tener contracciones uterinas regulares y dolorosas.  El tiempo entre las contracciones debe acortarse.  Las molestias y el dolor se harn ms intensos gradualmente.  El dolor de las contracciones empeora al caminar y no se alivia con el reposo.  El cuello del tero se hace mas delgado (se borra) y se dilata. ANTES DEL PARTO Una vez que se inicie el trabajo de parto y sea admitida en el hospital o sanatorio, el mdico podr hacer lo siguiente:   Realizar un examen fsico.  Controlar si hay complicaciones relacionadas con el trabajo de parto.  Verificar su presin arterial, temperatura y pulso y la frecuencia cardaca (signos vitales).  Determinar si se ha roto el saco amnitico y cundo ha ocurrido.  Realizar un examen vaginal (utilizando un guante estril y un lubricante) para determinar:  La posicin (presentacin) del beb. El beb se presenta con la cabeza primero (vertex) en el canal de parto (vagina), o estn los pies o las nalgas primero (de nalgas)?  El nivel (estacin) de la cabeza del beb dentro del canal de parto.  El borramiento y la dilatacin del cuello uterino.  El monitor fetal electrnico generalmente se coloca sobre el abdomen al llegar. Se utiliza para controlar  las contracciones y la frecuencia cardaca del beb.  Cuando el monitor est en el abdomen (monitor fetal externo), slo toma la frecuencia y la duracin de las contracciones. No informa acerca de la intensidad de las contracciones.  Si el mdico necesita saber exactamente la intensidad de las contracciones o cul es la frecuencia cardaca del beb, colocar un monitor interno en la vagina y el tero. El mdico comentar los riesgos y los beneficios de usar un monitor interno y le pedir autorizacin antes de colocar el dispositivo.  El monitoreo fetal continuo ser necesario si le han aplicado una epidural, si le administran ciertos medicamentos (como oxitocina) y si tiene complicaciones del embarazo o del trabajo de parto.  Podrn colocarle una va intravenosa en una vena del brazo para suministrarle lquidos y medicamentos, si es necesario. TRES ETAPAS DEL TRABAJO DE PARTO Y EL PARTO El trabajo de parto y el parto normales se dividen en tres etapas. Primera etapa Esta etapa comienza cuando comienzan las contracciones regulares y el cuello comienza a borrarse y dilatarse. Finaliza cuando el cuello est completamente abierto (completamente dilatado). La primera etapa es la etapa ms larga del trabajo de parto y puede durar desde 3 horas a 15 horas.  Algunos mtodos estn disponibles para ayudar con el dolor del parto. Usted y su mdico decidirn qu opcin es la mejor para usted. Las opciones incluyen:   Medicamentos narcticos. Estos son medicamentos fuertes que usted puede recibir a travs de una va intravenosa o como   inyeccin en el msculo. Estos medicamentos alivian el dolor pero no hacen que desaparezca completamente.  Epidural. Se administra un medicamento a travs de un tubo delgado que se inserta en la espalda. El medicamento adormece la parte inferior del cuerpo y evita el dolor en esa zona.  Bloqueo paracervical Es una inyeccin de un anestsico en cada lado del cuello  uterino.  Usted podr pedir un parto natural, que implica que no se usen analgsicos ni epidural durante el parto y el trabajo de parto. En cambio, podr tener otro tipo de ayuda como ejercicios respiratorios para hacer frente al dolor. Segunda etapa La segunda etapa del trabajo de parto comienza cuando el cuello se ha dilatado completamente a 10 cm. Contina hasta que usted puja al beb hacia abajo, por el canal de parto, y el beb nace. Esta etapa puede durar slo algunos minutos o algunas horas.  La posicin del la cabeza del beb a medida que pasa por el canal de parto, es informada como un nmero, llamado estacin. Si la cabeza del beb no ha iniciado su descenso, la estacin se describe como que est en menos 3 ( 3). Cuando la cabeza del beb est en la estacin cero, est en el medio del canal de parto y se encaja en la pelvis. La estacin en la que se encuentra el beb indica el progreso de la segunda etapa del trabajo de parto.  Cuando el beb nace, el mdico lo sostendr con la cabeza hacia abajo para evitar que el lquido amnitico, el moco y la sangre entren en los pulmones del beb. La boca y la nariz del beb podrn ser succionadas con un pequeo bulbo para retirar todo lquido adicional.  El mdico podr colocar al beb sobre su estmago. Es importante evitar que el beb tome fro. Para hacerlo, el mdico secar al beb, lo colocar directamente sobre su piel, (sin mantas entre usted y el beb) y lo cubrir con mantas secas y tibias.  Se corta el cordn umbilical. Tercera etapa Durante la tercera etapa del trabajo de parto, el mdico sacar la placenta (alumbramiento) y se asegurar de que el sangrado est controlado. La salida de la placenta generalmente demora 5 minutos pero puede tardar hasta 30 minutos. Luego de la salida de la placenta, le darn un medicamento por va intravenosa o inyectable para ayudar a contraer el tero y controlar el sangrado. Si planea amamantar al beb, puede  intentar en este momento. Luego de la salida de la placenta, el tero debe contraerse y quedar muy firme. Si el tero no queda firme, el mdico lo masajear. Esto es importante debido a que la contraccin del tero ayuda a cortar el sangrado en el sitio en que la placenta estaba unida al tero. Si el tero no se contrae adecuadamente ni permanece firme, podr causar un sangrado abundante. Si hay mucho sangrado, podrn darle medicamentos para contraer el tero y detener el sangrado.  Document Released: 12/28/2007 Document Revised: 09/16/2012 ExitCare Patient Information 2014 [employer].  

## 2013-05-05 ENCOUNTER — Encounter: Payer: Self-pay | Admitting: Obstetrics and Gynecology

## 2013-05-05 ENCOUNTER — Ambulatory Visit (INDEPENDENT_AMBULATORY_CARE_PROVIDER_SITE_OTHER): Payer: Medicaid Other | Admitting: Obstetrics and Gynecology

## 2013-05-05 VITALS — BP 124/74 | Temp 98.4°F | Wt 156.4 lb

## 2013-05-05 DIAGNOSIS — Z3490 Encounter for supervision of normal pregnancy, unspecified, unspecified trimester: Secondary | ICD-10-CM

## 2013-05-05 DIAGNOSIS — Z3480 Encounter for supervision of other normal pregnancy, unspecified trimester: Secondary | ICD-10-CM

## 2013-05-05 LAB — POCT URINALYSIS DIP (DEVICE)
Bilirubin Urine: NEGATIVE
Glucose, UA: NEGATIVE mg/dL
KETONES UR: NEGATIVE mg/dL
Leukocytes, UA: NEGATIVE
Nitrite: NEGATIVE
PH: 5.5 (ref 5.0–8.0)
Protein, ur: NEGATIVE mg/dL
Specific Gravity, Urine: 1.02 (ref 1.005–1.030)
UROBILINOGEN UA: 0.2 mg/dL (ref 0.0–1.0)

## 2013-05-05 NOTE — Progress Notes (Signed)
Doing well. GFM.  No further brown discharge. Explained postdates testing if needed.

## 2013-05-05 NOTE — Progress Notes (Signed)
P= 94 Edema in hands and feet.  C/o of intermittent lower abdominal/pelvic pressure and irregular contractions.

## 2013-05-12 ENCOUNTER — Ambulatory Visit (INDEPENDENT_AMBULATORY_CARE_PROVIDER_SITE_OTHER): Payer: Medicaid Other | Admitting: Family Medicine

## 2013-05-12 ENCOUNTER — Telehealth (HOSPITAL_COMMUNITY): Payer: Self-pay | Admitting: *Deleted

## 2013-05-12 ENCOUNTER — Encounter: Payer: Self-pay | Admitting: Family Medicine

## 2013-05-12 ENCOUNTER — Encounter (HOSPITAL_COMMUNITY): Payer: Self-pay

## 2013-05-12 ENCOUNTER — Inpatient Hospital Stay (HOSPITAL_COMMUNITY)
Admission: AD | Admit: 2013-05-12 | Discharge: 2013-05-14 | DRG: 775 | Disposition: A | Discharge status: Home / Self Care | Payer: Medicaid Other | Source: Ambulatory Visit | Attending: Obstetrics & Gynecology | Admitting: Obstetrics & Gynecology

## 2013-05-12 VITALS — BP 118/74 | Wt 155.5 lb

## 2013-05-12 DIAGNOSIS — O328XX0 Maternal care for other malpresentation of fetus, not applicable or unspecified: Secondary | ICD-10-CM | POA: Diagnosis present

## 2013-05-12 DIAGNOSIS — Z8249 Family history of ischemic heart disease and other diseases of the circulatory system: Secondary | ICD-10-CM

## 2013-05-12 DIAGNOSIS — Z789 Other specified health status: Secondary | ICD-10-CM

## 2013-05-12 DIAGNOSIS — I8390 Asymptomatic varicose veins of unspecified lower extremity: Secondary | ICD-10-CM

## 2013-05-12 DIAGNOSIS — Z87442 Personal history of urinary calculi: Secondary | ICD-10-CM

## 2013-05-12 DIAGNOSIS — M4120 Other idiopathic scoliosis, site unspecified: Secondary | ICD-10-CM | POA: Diagnosis present

## 2013-05-12 DIAGNOSIS — O4290 Premature rupture of membranes, unspecified as to length of time between rupture and onset of labor, unspecified weeks of gestation: Principal | ICD-10-CM | POA: Diagnosis present

## 2013-05-12 DIAGNOSIS — Z3490 Encounter for supervision of normal pregnancy, unspecified, unspecified trimester: Secondary | ICD-10-CM

## 2013-05-12 DIAGNOSIS — N289 Disorder of kidney and ureter, unspecified: Secondary | ICD-10-CM | POA: Diagnosis present

## 2013-05-12 DIAGNOSIS — O480 Post-term pregnancy: Secondary | ICD-10-CM

## 2013-05-12 DIAGNOSIS — O26839 Pregnancy related renal disease, unspecified trimester: Secondary | ICD-10-CM | POA: Diagnosis present

## 2013-05-12 LAB — CBC
HCT: 34.7 % — ABNORMAL LOW (ref 36.0–46.0)
Hemoglobin: 11.7 g/dL — ABNORMAL LOW (ref 12.0–15.0)
MCH: 30.9 pg (ref 26.0–34.0)
MCHC: 33.7 g/dL (ref 30.0–36.0)
MCV: 91.6 fL (ref 78.0–100.0)
PLATELETS: 161 10*3/uL (ref 150–400)
RBC: 3.79 MIL/uL — ABNORMAL LOW (ref 3.87–5.11)
RDW: 14 % (ref 11.5–15.5)
WBC: 7.2 10*3/uL (ref 4.0–10.5)

## 2013-05-12 LAB — POCT URINALYSIS DIP (DEVICE)
Bilirubin Urine: NEGATIVE
Glucose, UA: NEGATIVE mg/dL
Ketones, ur: NEGATIVE mg/dL
Leukocytes, UA: NEGATIVE
NITRITE: NEGATIVE
PH: 5.5 (ref 5.0–8.0)
Protein, ur: NEGATIVE mg/dL
SPECIFIC GRAVITY, URINE: 1.025 (ref 1.005–1.030)
Urobilinogen, UA: 0.2 mg/dL (ref 0.0–1.0)

## 2013-05-12 LAB — ABO/RH: ABO/RH(D): A POS

## 2013-05-12 LAB — TYPE AND SCREEN
ABO/RH(D): A POS
Antibody Screen: NEGATIVE

## 2013-05-12 MED ORDER — OXYTOCIN 40 UNITS IN LACTATED RINGERS INFUSION - SIMPLE MED
1.0000 m[IU]/min | INTRAVENOUS | Status: DC
  Administered 2013-05-12: 2 m[IU]/min via INTRAVENOUS
  Filled 2013-05-12: qty 1000

## 2013-05-12 MED ORDER — ACETAMINOPHEN 325 MG PO TABS
650.0000 mg | ORAL_TABLET | ORAL | Status: DC | PRN
Start: 2013-05-12 — End: 2013-05-13

## 2013-05-12 MED ORDER — FLEET ENEMA 7-19 GM/118ML RE ENEM: 1.0000 | ENEMA | RECTAL | Status: DC | PRN

## 2013-05-12 MED ORDER — LACTATED RINGERS IV SOLN: 500.0000 mL | INTRAVENOUS | Status: DC | PRN

## 2013-05-12 MED ORDER — ONDANSETRON HCL 4 MG/2ML IJ SOLN: 4.0000 mg | Freq: Four times a day (QID) | INTRAMUSCULAR | Status: DC | PRN

## 2013-05-12 MED ORDER — OXYTOCIN BOLUS FROM INFUSION: 500.0000 mL | INTRAVENOUS | Status: DC

## 2013-05-12 MED ORDER — FENTANYL CITRATE 0.05 MG/ML IJ SOLN
100.0000 ug | INTRAMUSCULAR | Status: DC | PRN
  Administered 2013-05-12 – 2013-05-13 (×3): 100 ug via INTRAVENOUS
  Filled 2013-05-12 (×3): qty 2

## 2013-05-12 MED ORDER — LIDOCAINE HCL (PF) 1 % IJ SOLN
30.0000 mL | INTRAMUSCULAR | Status: DC | PRN
  Filled 2013-05-12: qty 30

## 2013-05-12 MED ORDER — LACTATED RINGERS IV SOLN
INTRAVENOUS | Status: DC
  Administered 2013-05-12: 22:00:00 via INTRAVENOUS

## 2013-05-12 MED ORDER — OXYCODONE-ACETAMINOPHEN 5-325 MG PO TABS: 1.0000 | ORAL_TABLET | ORAL | Status: DC | PRN

## 2013-05-12 MED ORDER — TERBUTALINE SULFATE 1 MG/ML IJ SOLN: 0.2500 mg | Freq: Once | INTRAMUSCULAR | Status: AC | PRN

## 2013-05-12 MED ORDER — OXYTOCIN 40 UNITS IN LACTATED RINGERS INFUSION - SIMPLE MED: 62.5000 mL/h | INTRAVENOUS | Status: DC

## 2013-05-12 MED ORDER — CITRIC ACID-SODIUM CITRATE 334-500 MG/5ML PO SOLN: 30.0000 mL | ORAL | Status: DC | PRN

## 2013-05-12 MED ORDER — IBUPROFEN 600 MG PO TABS
600.0000 mg | ORAL_TABLET | Freq: Four times a day (QID) | ORAL | Status: DC | PRN
  Administered 2013-05-13: 600 mg via ORAL
  Filled 2013-05-12: qty 1

## 2013-05-12 NOTE — H&P (Signed)
LABOR ADMISSION HISTORY AND PHYSICAL   Khaylee Lubertha SayresRodriguez Sanchez is a 33 y.o. female G3P1011 with IUP at 4751w1d by LMP c/w 12 week US presenting for ROM.   Was seen in the office this AM and all was good.  She then went home and got up from lunch around 1pm and had a large gush of fluid. She has continued to leak since then. Fluid is greenish in color.  +FM. No VB. Contractions have started but are just uncomfortable and not painful.     [facility]. Negative 1st trim screen. Normal US.  GBS neg.    Prenatal History/Complications:  Past Medical History: Past Medical History  Diagnosis Date  . Varicose veins   . Scoliosis (and kyphoscoliosis), idiopathic   . Backache, unspecified   . Kidney stones     Past Surgical History: Past Surgical History  Procedure Laterality Date  . Abdominal surgery    . Appendectomy    . Wisdom tooth extraction      Obstetrical History: OB History   Grav Para Term Preterm Abortions TAB SAB Ect Mult Living   3 1 1  1 1    1     G1- NSVD 6lb4oz G2- TAB G3- current   Social History: History   Social History  . Marital Status: Married    Spouse Name: N/A    Number of Children: N/A  . Years of Education: N/A   Social History Main Topics  . Smoking status: Never Smoker   . Smokeless tobacco: Never Used  . Alcohol Use: No  . Drug Use: No  . Sexual Activity: Yes   Other Topics Concern  . None   Social History Narrative  . None    Family History: Family History  Problem Relation Age of Onset  . Heart disease Mother   . Hypertension Father   . Other Father     varicose veins  . Heart attack Father   . Hyperlipidemia Sister   . Cancer Brother     lung    Allergies: No Known Allergies  Facility-administered medications prior to admission  Medication Dose Route Frequency Provider Last Rate Last Dose  . Tdap (BOOSTRIX) injection 0.5 mL  0.5 mL Intramuscular Once [place] Smith, CNM       Prescriptions prior to  admission  Medication Sig Dispense Refill  . Prenatal Vit-Fe Fumarate-FA (PRENATAL MULTIVITAMIN) TABS tablet Take 1 tablet by mouth daily at 12 noon.         Review of Systems  All systems reviewed and negative except as stated in HPI    Last menstrual period 08/04/2012. General appearance: alert, cooperative and no distress Lungs: clear to auscultation bilaterally Heart: regular rate and rhythm Abdomen: soft, non-tender; bowel sounds normal Extremities: Homans sign is negative, no sign of DVT  Presentation: cephalic Fetal monitoringBaseline: 135 bpm, Variability: Good {> 6 bpm), Accelerations: Reactive and Decelerations: Absent Uterine activity q1-243min   Dilation: 1 Effacement (%): Thick Station: Ballotable Exam by:: Sharen Hintaroline Brewer RN   Prenatal labs: ABO, Rh: A/POS/-- (08/28 1601) Antibody: NEG (08/28 1601) Rubella:   RPR: NON REAC (02/11 1440)  HBsAg: NEGATIVE (08/28 1601)  HIV: NON REACTIVE (01/28 1059)  GBS: Negative (03/25 0000)  1 hr Glucola 94 Genetic screening  NT normal Anatomy US normal    Assessment: Panagiota Lubertha SayresRodriguez Sanchez is a 33 y.o. G3P1011 with an IUP at 851w1d by LMP c/w 12 week US presenting for ROM. Grossly ruptured on exam  with meconium stained fluid on the perineum and pad.  Some contractions but palpating mild.   Plan: 1) PROM - admit to L&D - routine orders - fentanyl for pain. Pt currently declines epidural - FB placed and inflated with 60cc of LR  - will cont to monitor for now and once FB is out, plan to start pitocin  2) FWB - initially in MAU had a 4 min decel to the 90s but has since had a beautifully reactive cat I tracing.  - cat I tracing now - GBS neg - EFW 7lb  3) anticipate SVD - breast feeding - paraguard for contraception    Vale HavenKeli L Jourdan Durbin, MD 05/12/2013, 4:01 PM

## 2013-05-12 NOTE — Progress Notes (Signed)
Called to notify of prolonged deceleration in FHR

## 2013-05-12 NOTE — Progress Notes (Signed)
Notified of pt arrival in MAU, positive fern and vaginal exam. Received orders to admit to labor and delivery

## 2013-05-12 NOTE — Progress Notes (Signed)
Reactive and reassuring NST +FM, no LOF, no VB, 2-3ctx /day  Janice Neal is a 33 y.o. G3P1011 at 5526w1d by L=12 here for ROB visit. Negative (03/25 0000)  Discussed with Patient:  - Plans to breast/bottle feed.  All questions answered. - Continue prenatal vitamins. - Reviewed fetal kick counts Pt to perform daily at a time when the baby is active, lie laterally with both hands on belly in quiet room and count all movements (hiccups, shoulder rolls, obvious kicks, etc); pt is to report to clinic MAU for less than 10 movements felt in a 2 hour time period-pt told as soon as she counts 10 movements the count is complete.  - Routine precautions discussed (depression, infection s/s).   Patient provided with all pertinent phone numbers for emergencies. - RTC for any VB, regular, painful cramps/ctxs occurring at a rate of >2/10 min, fever (100.5 or higher), n/v/d, any pain that is unresolving or worsening, LOF, decreased fetal movement, CP, SOB, edema -RTC for repeat NSt Friday or labor  Problems: Patient Active Problem List   Diagnosis Date Noted  . Supervision of normal pregnancy 10/27/2012  . Language barrier, speaks Spanish but can speak adequate English. 10/27/2012  . Varicose veins 10/30/2011    To Do: 1.   [ ]  Vaccines: recd [ ]  BCM: paragaurd [ x] Readiness: baby has a place to sleep, car seat, other baby necessities.  Edu: [x ] TL precautions; [ ]  BF class; [ ]  childbirth class; [ ]   BF counseling;

## 2013-05-12 NOTE — Progress Notes (Signed)
Pt desires IOL @ 41 wks - scheduled 4/21 @ 0630

## 2013-05-12 NOTE — Progress Notes (Signed)
Pulse: 96 Patient reports clear vaginal discharge. Some contractions and no pain.

## 2013-05-12 NOTE — MAU Note (Signed)
Contracting, started leaking greenish/clear fluid ar 1300. No bleeding. 2nd baby. No problems with either preg

## 2013-05-12 NOTE — Telephone Encounter (Signed)
Preadmission screen Interpreter number [phone]

## 2013-05-12 NOTE — Patient Instructions (Signed)
Third Trimester of Pregnancy  The third trimester is from week 29 through week 42, months 7 through 9. The third trimester is a time when the fetus is growing rapidly. At the end of the ninth month, the fetus is about 20 inches in length and weighs 6 10 pounds.   BODY CHANGES  Your body goes through many changes during pregnancy. The changes vary from woman to woman.    Your weight will continue to increase. You can expect to gain 25 35 pounds (11 16 kg) by the end of the pregnancy.   You may begin to get stretch marks on your hips, abdomen, and breasts.   You may urinate more often because the fetus is moving lower into your pelvis and pressing on your bladder.   You may develop or continue to have heartburn as a result of your pregnancy.   You may develop constipation because certain hormones are causing the muscles that push waste through your intestines to slow down.   You may develop hemorrhoids or swollen, bulging veins (varicose veins).   You may have pelvic pain because of the weight gain and pregnancy hormones relaxing your joints between the bones in your pelvis. Back aches may result from over exertion of the muscles supporting your posture.   Your breasts will continue to grow and be tender. A yellow discharge may leak from your breasts called colostrum.   Your belly button may stick out.   You may feel short of breath because of your expanding uterus.   You may notice the fetus "dropping," or moving lower in your abdomen.   You may have a bloody mucus discharge. This usually occurs a few days to a week before labor begins.   Your cervix becomes thin and soft (effaced) near your due date.  WHAT TO EXPECT AT YOUR PRENATAL EXAMS   You will have prenatal exams every 2 weeks until week 36. Then, you will have weekly prenatal exams. During a routine prenatal visit:   You will be weighed to make sure you and the fetus are growing normally.   Your blood pressure is taken.   Your abdomen will be  measured to track your baby's growth.   The fetal heartbeat will be listened to.   Any test results from the previous visit will be discussed.   You may have a cervical check near your due date to see if you have effaced.  At around 36 weeks, your caregiver will check your cervix. At the same time, your caregiver will also perform a test on the secretions of the vaginal tissue. This test is to determine if a type of bacteria, Group B streptococcus, is present. Your caregiver will explain this further.  Your caregiver may ask you:   What your birth plan is.   How you are feeling.   If you are feeling the baby move.   If you have had any abnormal symptoms, such as leaking fluid, bleeding, severe headaches, or abdominal cramping.   If you have any questions.  Other tests or screenings that may be performed during your third trimester include:   Blood tests that check for low iron levels (anemia).   Fetal testing to check the health, activity level, and growth of the fetus. Testing is done if you have certain medical conditions or if there are problems during the pregnancy.  FALSE LABOR  You may feel small, irregular contractions that eventually go away. These are called Braxton Hicks contractions, or   false labor. Contractions may last for hours, days, or even weeks before true labor sets in. If contractions come at regular intervals, intensify, or become painful, it is best to be seen by your caregiver.   SIGNS OF LABOR    Menstrual-like cramps.   Contractions that are 5 minutes apart or less.   Contractions that start on the top of the uterus and spread down to the lower abdomen and back.   A sense of increased pelvic pressure or back pain.   A watery or bloody mucus discharge that comes from the vagina.  If you have any of these signs before the 37th week of pregnancy, call your caregiver right away. You need to go to the hospital to get checked immediately.  HOME CARE INSTRUCTIONS    Avoid all  smoking, herbs, alcohol, and unprescribed drugs. These chemicals affect the formation and growth of the baby.   Follow your caregiver's instructions regarding medicine use. There are medicines that are either safe or unsafe to take during pregnancy.   Exercise only as directed by your caregiver. Experiencing uterine cramps is a good sign to stop exercising.   Continue to eat regular, healthy meals.   Wear a good support bra for breast tenderness.   Do not use hot tubs, steam rooms, or saunas.   Wear your seat belt at all times when driving.   Avoid raw meat, uncooked cheese, cat litter boxes, and soil used by cats. These carry germs that can cause birth defects in the baby.   Take your prenatal vitamins.   Try taking a stool softener (if your caregiver approves) if you develop constipation. Eat more high-fiber foods, such as fresh vegetables or fruit and whole grains. Drink plenty of fluids to keep your urine clear or pale yellow.   Take warm sitz baths to soothe any pain or discomfort caused by hemorrhoids. Use hemorrhoid cream if your caregiver approves.   If you develop varicose veins, wear support hose. Elevate your feet for 15 minutes, 3 4 times a day. Limit salt in your diet.   Avoid heavy lifting, wear low heal shoes, and practice good posture.   Rest a lot with your legs elevated if you have leg cramps or low back pain.   Visit your dentist if you have not gone during your pregnancy. Use a soft toothbrush to brush your teeth and be gentle when you floss.   A sexual relationship may be continued unless your caregiver directs you otherwise.   Do not travel far distances unless it is absolutely necessary and only with the approval of your caregiver.   Take prenatal classes to understand, practice, and ask questions about the labor and delivery.   Make a trial run to the hospital.   Pack your hospital bag.   Prepare the baby's nursery.   Continue to go to all your prenatal visits as directed  by your caregiver.  SEEK MEDICAL CARE IF:   You are unsure if you are in labor or if your water has broken.   You have dizziness.   You have mild pelvic cramps, pelvic pressure, or nagging pain in your abdominal area.   You have persistent nausea, vomiting, or diarrhea.   You have a bad smelling vaginal discharge.   You have pain with urination.  SEEK IMMEDIATE MEDICAL CARE IF:    You have a fever.   You are leaking fluid from your vagina.   You have spotting or bleeding from your vagina.     You have severe abdominal cramping or pain.   You have rapid weight loss or gain.   You have shortness of breath with chest pain.   You notice sudden or extreme swelling of your face, hands, ankles, feet, or legs.   You have not felt your baby move in over an hour.   You have severe headaches that do not go away with medicine.   You have vision changes.  Document Released: 01/08/2001 Document Revised: 09/16/2012 Document Reviewed: 03/17/2012  Exit[employer] Patient Information 2014 [employer].

## 2013-05-13 ENCOUNTER — Encounter (HOSPITAL_COMMUNITY): Payer: Medicaid Other | Admitting: Anesthesiology

## 2013-05-13 ENCOUNTER — Inpatient Hospital Stay (HOSPITAL_COMMUNITY): Payer: Medicaid Other | Admitting: Anesthesiology

## 2013-05-13 ENCOUNTER — Encounter (HOSPITAL_COMMUNITY): Payer: Self-pay | Admitting: *Deleted

## 2013-05-13 DIAGNOSIS — O4290 Premature rupture of membranes, unspecified as to length of time between rupture and onset of labor, unspecified weeks of gestation: Secondary | ICD-10-CM

## 2013-05-13 DIAGNOSIS — O26839 Pregnancy related renal disease, unspecified trimester: Secondary | ICD-10-CM

## 2013-05-13 DIAGNOSIS — M4120 Other idiopathic scoliosis, site unspecified: Secondary | ICD-10-CM

## 2013-05-13 LAB — RPR

## 2013-05-13 MED ORDER — TETANUS-DIPHTH-ACELL PERTUSSIS 5-2.5-18.5 LF-MCG/0.5 IM SUSP: 0.5000 mL | Freq: Once | INTRAMUSCULAR | Status: DC

## 2013-05-13 MED ORDER — LACTATED RINGERS IV SOLN: 500.0000 mL | Freq: Once | INTRAVENOUS | Status: DC

## 2013-05-13 MED ORDER — DIPHENHYDRAMINE HCL 50 MG/ML IJ SOLN: 12.5000 mg | INTRAMUSCULAR | Status: DC | PRN

## 2013-05-13 MED ORDER — PHENYLEPHRINE 40 MCG/ML (10ML) SYRINGE FOR IV PUSH (FOR BLOOD PRESSURE SUPPORT)
80.0000 ug | PREFILLED_SYRINGE | INTRAVENOUS | Status: DC | PRN
  Filled 2013-05-13: qty 2

## 2013-05-13 MED ORDER — EPHEDRINE 5 MG/ML INJ
INTRAVENOUS | Status: AC
  Filled 2013-05-13: qty 4

## 2013-05-13 MED ORDER — LIDOCAINE HCL (PF) 1 % IJ SOLN
INTRAMUSCULAR | Status: DC | PRN
  Administered 2013-05-13 (×2): 4 mL

## 2013-05-13 MED ORDER — IBUPROFEN 600 MG PO TABS
600.0000 mg | ORAL_TABLET | Freq: Four times a day (QID) | ORAL | Status: DC
  Administered 2013-05-13 – 2013-05-14 (×4): 600 mg via ORAL
  Filled 2013-05-13 (×4): qty 1

## 2013-05-13 MED ORDER — PRENATAL MULTIVITAMIN CH
1.0000 | ORAL_TABLET | Freq: Every day | ORAL | Status: DC
  Administered 2013-05-13: 1 via ORAL
  Filled 2013-05-13: qty 1

## 2013-05-13 MED ORDER — FENTANYL 2.5 MCG/ML BUPIVACAINE 1/10 % EPIDURAL INFUSION (WH - ANES): 14.0000 mL/h | INTRAMUSCULAR | Status: DC | PRN

## 2013-05-13 MED ORDER — FENTANYL 2.5 MCG/ML BUPIVACAINE 1/10 % EPIDURAL INFUSION (WH - ANES)
INTRAMUSCULAR | Status: AC
  Filled 2013-05-13: qty 125

## 2013-05-13 MED ORDER — EPHEDRINE 5 MG/ML INJ
10.0000 mg | INTRAVENOUS | Status: DC | PRN
  Filled 2013-05-13: qty 2

## 2013-05-13 MED ORDER — SENNOSIDES-DOCUSATE SODIUM 8.6-50 MG PO TABS
2.0000 | ORAL_TABLET | ORAL | Status: DC
  Administered 2013-05-13: 2 via ORAL
  Filled 2013-05-13: qty 2

## 2013-05-13 MED ORDER — OXYMETAZOLINE HCL 0.05 % NA SOLN
1.0000 | Freq: Two times a day (BID) | NASAL | Status: DC
  Administered 2013-05-13: 1 via NASAL
  Filled 2013-05-13: qty 15

## 2013-05-13 MED ORDER — WITCH HAZEL-GLYCERIN EX PADS: 1.0000 "application " | MEDICATED_PAD | CUTANEOUS | Status: DC | PRN

## 2013-05-13 MED ORDER — DIBUCAINE 1 % RE OINT: 1.0000 "application " | TOPICAL_OINTMENT | RECTAL | Status: DC | PRN

## 2013-05-13 MED ORDER — SIMETHICONE 80 MG PO CHEW: 80.0000 mg | CHEWABLE_TABLET | ORAL | Status: DC | PRN

## 2013-05-13 MED ORDER — DIPHENHYDRAMINE HCL 25 MG PO CAPS
25.0000 mg | ORAL_CAPSULE | Freq: Four times a day (QID) | ORAL | Status: DC | PRN
Start: 2013-05-13 — End: 2013-05-14

## 2013-05-13 MED ORDER — FENTANYL 2.5 MCG/ML BUPIVACAINE 1/10 % EPIDURAL INFUSION (WH - ANES)
INTRAMUSCULAR | Status: DC | PRN
  Administered 2013-05-13: 11 mL/h via EPIDURAL

## 2013-05-13 MED ORDER — ONDANSETRON HCL 4 MG PO TABS
4.0000 mg | ORAL_TABLET | ORAL | Status: DC | PRN
Start: 2013-05-13 — End: 2013-05-14

## 2013-05-13 MED ORDER — PHENYLEPHRINE 40 MCG/ML (10ML) SYRINGE FOR IV PUSH (FOR BLOOD PRESSURE SUPPORT)
80.0000 ug | PREFILLED_SYRINGE | INTRAVENOUS | Status: DC | PRN
  Filled 2013-05-13: qty 2
  Filled 2013-05-13: qty 10

## 2013-05-13 MED ORDER — ONDANSETRON HCL 4 MG/2ML IJ SOLN: 4.0000 mg | INTRAMUSCULAR | Status: DC | PRN

## 2013-05-13 MED ORDER — OXYCODONE-ACETAMINOPHEN 5-325 MG PO TABS: 1.0000 | ORAL_TABLET | ORAL | Status: DC | PRN

## 2013-05-13 MED ORDER — LANOLIN HYDROUS EX OINT: TOPICAL_OINTMENT | CUTANEOUS | Status: DC | PRN

## 2013-05-13 MED ORDER — BENZOCAINE-MENTHOL 20-0.5 % EX AERO: 1.0000 "application " | INHALATION_SPRAY | CUTANEOUS | Status: DC | PRN

## 2013-05-13 MED ORDER — PHENYLEPHRINE 40 MCG/ML (10ML) SYRINGE FOR IV PUSH (FOR BLOOD PRESSURE SUPPORT)
PREFILLED_SYRINGE | INTRAVENOUS | Status: AC
  Filled 2013-05-13: qty 5

## 2013-05-13 MED ORDER — ZOLPIDEM TARTRATE 5 MG PO TABS: 5.0000 mg | ORAL_TABLET | Freq: Every evening | ORAL | Status: DC | PRN

## 2013-05-13 NOTE — Lactation Note (Signed)
This note was copied from the chart of Boy Maxx Lubertha SayresRodriguez Sanchez. Lactation Consultation Note Initial visit at 15 hours of age.  Baby has had several feedings.  Mom reports some nipple pain.  She has large long nipples and baby latches shallow.  Assisted with cross cradle then football hold.  Baby can get a deep latch with slight chin tug and mom reports less pain.  Encouraged mom to not pull breast back to allow air space and encouraged baby to have nose, chin and cheek up against breast for deep latch. Baby pulls off several times and positional stripe noted on left breast.  Hand expression demonstrated with several drops of colostrum visible.  Mom struggles with deep latch without assist at bedside, she will call for assist as needed.    Patient Name: Boy Talor Lubertha SayresRodriguez Sanchez Today's Date: 05/13/2013 Reason for consult: Initial assessment   Maternal Data Has patient been taught Hand Expression?: Yes Does the patient have breastfeeding experience prior to this delivery?: Yes  Feeding Feeding Type: Breast Fed Length of feed:  (about 8 minutes observed)  LATCH Score/Interventions Latch: Repeated attempts needed to sustain latch, nipple held in mouth throughout feeding, stimulation needed to elicit sucking reflex. Intervention(s): Adjust position;Assist with latch;Breast compression  Audible Swallowing: A few with stimulation  Type of Nipple: Everted at rest and after stimulation  Comfort (Breast/Nipple): Soft / non-tender     Hold (Positioning): Assistance needed to correctly position infant at breast and maintain latch. Intervention(s): Breastfeeding basics reviewed;Support Pillows;Position options;Skin to skin  LATCH Score: 7  Lactation Tools Discussed/Used     Consult Status Consult Status: Follow-up Date: 05/14/13 Follow-up type: In-patient    Arvella MerlesJana Lynn Kershawhealthhoptaw 05/13/2013, 6:33 PM

## 2013-05-13 NOTE — Progress Notes (Signed)
Janice Neal is a 33 y.o. G3P1011 at 7171w2d admitted for PROM  Subjective:  Hurting now with contractions.  Lots of pressure. FB out at 2130 and now on pitocin.    Objective: BP 94/59  Pulse 91  Temp(Src) 98.3 F (36.8 C) (Oral)  Resp 18  Ht 4' 11.06" (1.5 m)  Wt 70.308 kg (155 lb)  BMI 31.25 kg/m2  SpO2 97%  LMP 08/04/2012      FHT:  FHR: 130 bpm, variability: moderate,  accelerations:  Present,  decelerations:  Present variable in early pattern UC:   regular, every 2-4 minutes SVE:   Dilation: 4.5 Effacement (%): 50 Station: -2;-3 Exam by:: LCarpenter,Rn  Labs: Lab Results  Component Value Date   WBC 7.2 05/12/2013   HGB 11.7* 05/12/2013   HCT 34.7* 05/12/2013   MCV 91.6 05/12/2013   PLT 161 05/12/2013    Assessment / Plan: IOL for PROM and s/p fb now on pitocin  Labor: cont increasing pitocin Fetal Wellbeing:  Category I Pain Control:  Fentanyl I/D:  n/a Anticipated MOD:  NSVD  Jaesean Litzau L Katheleen Stella 05/13/2013, 12:06 AM

## 2013-05-13 NOTE — Anesthesia Postprocedure Evaluation (Signed)
Anesthesia Post Note  Patient: Janice Neal  Procedure(s) Performed: * No procedures listed *  Anesthesia type: Epidural  Patient location: Mother/Baby  Post pain: Pain level controlled  Post assessment: Post-op Vital signs reviewed  Last Vitals:  Filed Vitals:   05/13/13 0630  BP: 102/61  Pulse: 80  Temp: 36.7 C  Resp: 18    Post vital signs: Reviewed  Level of consciousness:alert  Complications: No apparent anesthesia complications

## 2013-05-13 NOTE — Anesthesia Procedure Notes (Signed)
Epidural Patient location during procedure: OB Start time: 05/13/2013 1:28 AM  Staffing Anesthesiologist: Ezekiah Massie A. Performed by: anesthesiologist   Preanesthetic Checklist Completed: patient identified, site marked, surgical consent, pre-op evaluation, timeout performed, IV checked, risks and benefits discussed and monitors and equipment checked  Epidural Patient position: sitting Prep: site prepped and draped and DuraPrep Patient monitoring: continuous pulse ox and blood pressure Approach: midline Location: L3-L4 Injection technique: LOR air  Needle:  Needle type: Tuohy  Needle gauge: 17 G Needle length: 9 cm and 9 Needle insertion depth: 4 cm Catheter type: closed end flexible Catheter size: 19 Gauge Catheter at skin depth: 9 cm Test dose: negative and Other  Assessment Events: blood not aspirated, injection not painful, no injection resistance, negative IV test and no paresthesia  Additional Notes Patient identified. Risks and benefits discussed including failed block, incomplete  Pain control, post dural puncture headache, nerve damage, paralysis, blood pressure Changes, nausea, vomiting, reactions to medications-both toxic and allergic and post Partum back pain. All questions were answered. Patient expressed understanding and wished to proceed. Sterile technique was used throughout procedure. Epidural site was Dressed with sterile barrier dressing. No paresthesias, signs of intravascular injection Or signs of intrathecal spread were encountered.  Patient was more comfortable after the epidural was dosed. Please see RN's note for documentation of vital signs and FHR which are stable.

## 2013-05-13 NOTE — Anesthesia Preprocedure Evaluation (Signed)
Anesthesia Evaluation  Patient identified by MRN, date of birth, ID band Patient awake    Reviewed: Allergy & Precautions, H&P , Patient's Chart, lab work & pertinent test results  Airway Mallampati: III TM Distance: >3 FB Neck ROM: Full    Dental no notable dental hx. (+) Teeth Intact   Pulmonary neg pulmonary ROS,  breath sounds clear to auscultation  Pulmonary exam normal       Cardiovascular negative cardio ROS  Rhythm:Regular Rate:Normal     Neuro/Psych negative psych ROS   GI/Hepatic negative GI ROS, Neg liver ROS,   Endo/Other  negative endocrine ROS  Renal/GU Renal disease  negative genitourinary   Musculoskeletal negative musculoskeletal ROS (+)   Abdominal   Peds  Hematology negative hematology ROS (+)   Anesthesia Other Findings   Reproductive/Obstetrics (+) Pregnancy                           Anesthesia Physical Anesthesia Plan  ASA: II  Anesthesia Plan: Epidural   Post-op Pain Management:    Induction:   Airway Management Planned: Natural Airway  Additional Equipment:   Intra-op Plan:   Post-operative Plan:   Informed Consent: I have reviewed the patients History and Physical, chart, labs and discussed the procedure including the risks, benefits and alternatives for the proposed anesthesia with the patient or authorized representative who has indicated his/her understanding and acceptance.     Plan Discussed with: Anesthesiologist  Anesthesia Plan Comments:         Anesthesia Quick Evaluation

## 2013-05-13 NOTE — Progress Notes (Signed)
Alvilda Lubertha SayresRodriguez Sanchez is a 33 y.o. G3P1011 at 2868w2d admitted for PROM  Subjective:  Called to pt room by nursing for prolonged 8min decel to the 90s. Prior to that had received an epidural and afterwards was having some early decels with contractions.  +FM.    Objective: BP 122/58  Pulse 92  Temp(Src) 98.1 F (36.7 C) (Oral)  Resp 20  Ht 4' 11.06" (1.5 m)  Wt 70.308 kg (155 lb)  BMI 31.25 kg/m2  SpO2 97%  LMP 08/04/2012      FHT:  Initially baseline in the 90s and low 100s with the single prolonged decel and variables with each contraction. Moderate variability and +accels in between.  UC:   regular, every 2 minutes SVE:   Dilation: 9 Effacement (%): 90;80 Station: +1 Exam by:: Dr.Hamp Moreland  Labs: Lab Results  Component Value Date   WBC 7.2 05/12/2013   HGB 11.7* 05/12/2013   HCT 34.7* 05/12/2013   MCV 91.6 05/12/2013   PLT 161 05/12/2013    Assessment / Plan: IOL for prom rapidly progressing over the last hour from 5.5/50 to 9/90  Labor: progressing rapidly and now with signs of fetal distress.  Pitocin stopped with improvement in HR to a baseline of 125 with mod variability and variables wtih contractions but with a nadir in the 100s.   Fetal Wellbeing:  Category II Pain Control:  Epidural I/D:  n/a Anticipated MOD:  Discussed with pt that HR is concerning with how it appears.  I still believe she can have a successful vaginal delivery particularly with how rapidly she is changing but should her HR drop and stay down and we are unable to bring it up, we would need to proceed with emergency c/s.  Pt understands this and is requesting to continue to try for vaginal delivery.   Cela Newcom L Jaunita Mikels 05/13/2013, 2:58 AM

## 2013-05-14 ENCOUNTER — Other Ambulatory Visit: Payer: Medicaid Other

## 2013-05-14 LAB — CBC
HCT: 32.8 % — ABNORMAL LOW (ref 36.0–46.0)
HEMOGLOBIN: 11.1 g/dL — AB (ref 12.0–15.0)
MCH: 31.4 pg (ref 26.0–34.0)
MCHC: 33.8 g/dL (ref 30.0–36.0)
MCV: 92.7 fL (ref 78.0–100.0)
Platelets: 143 10*3/uL — ABNORMAL LOW (ref 150–400)
RBC: 3.54 MIL/uL — ABNORMAL LOW (ref 3.87–5.11)
RDW: 14.4 % (ref 11.5–15.5)
WBC: 11.1 10*3/uL — ABNORMAL HIGH (ref 4.0–10.5)

## 2013-05-14 MED ORDER — IBUPROFEN 600 MG PO TABS
600.0000 mg | ORAL_TABLET | Freq: Four times a day (QID) | ORAL | Status: AC
Start: 2013-05-14 — End: ?

## 2013-05-14 NOTE — Lactation Note (Signed)
This note was copied from the chart of Janice Loriene Lubertha SayresRodriguez Neal. Lactation Consultation Note [facility] Assisted w/position and pillows. Assisted in football position. C/o soreness. Comfort gels given. Has small areolas and elongated nipples. Rm. Very hot, baby wrapped in 2 blankets and had sleeper. Put baby STS.enouraged mom to feed STS to keep baby stimulated. Patient Name: Janice Neal ZOXWR'UToday's Date: 05/14/2013 Reason for consult: Follow-up assessment   Maternal Data    Feeding Feeding Type: Breast Fed  LATCH Score/Interventions Latch: Grasps breast easily, tongue down, lips flanged, rhythmical sucking. Intervention(s): Assist with latch;Adjust position  Audible Swallowing: None Intervention(s): Skin to skin Intervention(s): Skin to skin;Alternate breast massage  Type of Nipple: Everted at rest and after stimulation  Comfort (Breast/Nipple): Filling, red/small blisters or bruises, mild/mod discomfort  Problem noted: Mild/Moderate discomfort Interventions (Mild/moderate discomfort): Comfort gels  Hold (Positioning): Assistance needed to correctly position infant at breast and maintain latch. Intervention(s): Position options;Skin to skin;Support Pillows;Breastfeeding basics reviewed  LATCH Score: 6  Lactation Tools Discussed/Used     Consult Status Consult Status: Follow-up Follow-up type: In-patient    Charyl DancerLaura G Jevan Gaunt 05/14/2013, 4:17 AM

## 2013-05-14 NOTE — Discharge Summary (Signed)
Obstetric Discharge Summary Reason for Admission: Premature Rupture of Membranes Prenatal Procedures: none Intrapartum Procedures: spontaneous vaginal delivery Postpartum Procedures: none Complications-Operative and Postpartum: none  Hospital Course: [name] is a 33 y.o. [id number] who presented for PROM and delivered a viable baby boy at 03:24 on 05/13/2013. The patient is doing well, tolerating PO, ambulating well, voiding, +BM. Vaginal bleeding has backed down significantly. Mom is breastfeeding and [facility]. She declined having a circumcision for her baby.   Plan is to d/c home today. Pediatrician is continuing f/u with her baby for voiding.  Delivery Note At 3:24 AM a viable female was delivered via Vaginal, Spontaneous Delivery (Presentation: Right Occiput Anterior). APGAR: 8, 9; weight TBD.  Placenta status: Intact, Spontaneous. Cord: 3 vessels with the following complications: nuchal cord x1, body cord, and compound presentation of a hand. Cord pH: 7.29 (venous)  Anesthesia: Epidural  Episiotomy: None  Lacerations: None  Suture Repair: na  Est. Blood Loss (mL): 300  Mom to postpartum. Baby to Couplet care / Skin to Skin.  Pt progressed rapidly to complete and was having significant variable decels to the 90s. She was found to have an anterior lip and was able to push passed it with guidance. She pushed with 4 contractions to deliver a liveborn female. Head presented and nuchal cord x1 was reduced on the perineum. A compound presentation was noted with the posterior hand and was easily delivered. Then the rest of the body was delivered through a loose body cord. All of the above likely accounting for the deep variables. Baby with spontaneous cry and placed on mom's abdomen. Delayed cord clamping performed and cut by FOB. Cord gas sent and cord blood drawn. Placenta delivered intact with 3V cord with traction and pitocin. Uterine massage administered. No tears or  complications. Mom to postpartum and baby to maternal abdomen.   Janice BambergMario Neal 05/14/2013, 7:54 AM     H/H: Lab Results  Component Value Date/Time   HGB 11.1* 05/14/2013  5:55 AM   HCT 32.8* 05/14/2013  5:55 AM    Filed Vitals:   05/14/13 0638  BP: 92/56  Pulse: 79  Temp: 97.9 F (36.6 C)  Resp: 20    Physical Exam: VSS NAD Abd: Appropriately tender, ND, Fundus @U -firm Incision: none No c/c/e, Neg homan's sign, neg cords Lochia Appropriate  Discharge Diagnoses: Term Pregnancy-delivered  Discharge Information: Date: 08/09/2010 Activity: pelvic rest Diet: routine  Medications: None Breast feeding:  Yes Condition: stable Instructions: refer to handout Discharge to: home    Future Appointments Provider Department Dept Phone   06/18/2013 8:50 AM Vale HavenKeli L Victoriana Aziz, MD [facility] [phone]       Medication List    ASK your doctor about these medications       prenatal multivitamin Tabs tablet  Take 1 tablet by mouth daily at 12 noon.         Janice BambergMario Neal 05/14/2013,7:54 AM    I have seen and examined this patient and agree with above documentation in the PA student's note.   Rulon AbideKeli Axell Trigueros, M.D. [facility] 05/14/2013 9:33 AM

## 2013-05-14 NOTE — Discharge Instructions (Signed)

## 2013-05-14 NOTE — Progress Notes (Signed)
Ur chart review completed.  

## 2013-05-18 ENCOUNTER — Inpatient Hospital Stay (HOSPITAL_COMMUNITY): Admission: RE | Admit: 2013-05-18 | Payer: Medicaid Other | Source: Ambulatory Visit

## 2013-06-18 ENCOUNTER — Encounter: Payer: Self-pay | Admitting: Family Medicine

## 2013-06-18 ENCOUNTER — Ambulatory Visit (INDEPENDENT_AMBULATORY_CARE_PROVIDER_SITE_OTHER): Payer: Medicaid Other | Admitting: Family Medicine

## 2013-06-18 VITALS — BP 108/74 | HR 66 | Temp 98.5°F | Ht 59.0 in | Wt 133.2 lb

## 2013-06-18 DIAGNOSIS — Z01812 Encounter for preprocedural laboratory examination: Secondary | ICD-10-CM

## 2013-06-18 DIAGNOSIS — Z30430 Encounter for insertion of intrauterine contraceptive device: Secondary | ICD-10-CM

## 2013-06-18 LAB — POCT PREGNANCY, URINE: Preg Test, Ur: NEGATIVE

## 2013-06-18 MED ORDER — LEVONORGESTREL 20 MCG/24HR IU IUD
INTRAUTERINE_SYSTEM | Freq: Once | INTRAUTERINE | Status: AC
  Administered 2013-06-18: 1 via INTRAUTERINE

## 2013-06-18 NOTE — Patient Instructions (Signed)
Colocación de un dispositivo intrauterino - Cuidados posteriores  (Intrauterine Device Insertion, Care After)  Siga estas instrucciones durante las próximas semanas. Estas indicaciones le proporcionan información general acerca de cómo deberá cuidarse después del procedimiento. El médico también podrá darle instrucciones más específicas. El tratamiento ha sido planificado según las prácticas médicas actuales, pero en algunos casos pueden ocurrir problemas. Comuníquese con el médico si tiene algún problema o tiene dudas después del procedimiento.  QUÉ ESPERAR DESPUÉS DEL PROCEDIMIENTO  La inserción del DIU puede causar molestias, como cólicos. que deberían mejorar una vez que el DIU esté en su lugar. Podrá tener sangrado después del procedimiento. Esto es normal. Varía desde un sangrado ligero durante un par de días hasta un sangrado similar al menstrual. Cuando el DIU esté en su lugar, se extenderá un hilo de 1 a 2 pulgadas (2,5 a 5 cm) por el cuello del útero en la vagina. El hilo no debería molestarle a usted ni a su pareja. De lo contrario, consulte con su médico.   INSTRUCCIONES PARA EL CUIDADO EN EL HOGAR   · Controle su DIU para asegurarse de que esté en su lugar, antes de reanudar la actividad sexual. Tiene que sentir los hilos. Si no los siente, algo puede estar mal. El DIU puede haberse salido del útero o éste puede haber sido atravesado (perforado) durante la colocación. Además, si los hilos son más largos, puede significar que el DIU se está saliendo del útero. Si ocurre alguno de estos problemas, no estará protegida y podrá quedar embarazada.  · Puede volver a tener relaciones sexuales si no tiene problemas con el DIU. El DIU de cobre se considera efectivo y funciona de inmediato, si se inserta dentro de los 7 días del inicio del período. Será necesario que utilice un método anticonceptivo adicional durante 7 días, si el DIU se inserta en algún otro momento del ciclo.  · Controle que el DIU sigue en su  lugar sintiendo los hilos después de cada período menstrual.  · Es posible que necesite tomar analgésicos, como acetaminofeno o ibuprofeno. Tome todos los medicamentos como le indicó el médico.  SOLICITE ATENCIÓN MÉDICA SI:   · Tiene un sangrado más abundante o dura más de un ciclo menstrual normal.  · Tiene fiebre.  · Siente cólicos o dolor abdominal que no se alivian con medicamentos.  · Siente dolor abdominal que no parece estar relacionado con el área en que sentía los cólicos y el dolor anteriormente.  · Se siente mareada, inusualmente débil o se desmaya.  · Tiene flujo vaginal u olores anormales.  · Siente dolor durante las relaciones sexuales.  · No puede sentir los hilos del DIU o los siente más largos.  · Siente que el DIU está en la abertura del cuello del útero, en la vagina.  · Piensa que está embarazada o no tiene su período menstrual.  · El hilo del DIU está lastimando a su pareja sexual.  ASEGÚRESE DE QUE:  · Comprende estas instrucciones.  · Controlará su afección.  · Recibirá ayuda de inmediato si no mejora o si empeora.  Document Released: 10/09/2011 Document Revised: 11/04/2012  ExitCare® Patient Information ©2014 [employer].

## 2013-06-18 NOTE — Progress Notes (Signed)
Patient ID: Janice Neal, female   DOB: September 29, 1980, 33 y.o.   MRN: [id number] Subjective:    Janice Neal is a 33 y.o. [id number] Hispanic female who presents for a postpartum visit. She is 6 weeks postpartum following a spontaneous vaginal delivery. I have fully reviewed the prenatal and intrapartum course. The delivery was at 40 gestational weeks. Outcome: spontaneous vaginal delivery. Anesthesia: epidural. Postpartum course has been normal. Baby's course has been normal. Baby is feeding by breast. Bleeding no bleeding. Bowel function is normal. Bladder function is normal. Patient is not sexually active. Contraception method is IUD. Postpartum depression screening: negative.  The following portions of the patient's history were reviewed and updated as appropriate: allergies, current medications, past medical history, past surgical history and problem list.  Review of Systems Pertinent items are noted in HPI.   Filed Vitals:   06/18/13 0924  BP: 108/74  Pulse: 66  Temp: 98.5 F (36.9 C)  Height: 4\' 11"  (1.499 m)  Weight: 60.419 kg (133 lb 3.2 oz)    Objective:     General:  alert, cooperative and no distress   Breasts:  deferred, no complaints  Lungs: clear to auscultation bilaterally  Heart:  regular rate and rhythm  Abdomen: soft, nontender   Vulva: normal  Vagina: normal vagina  Cervix:  closed  Corpus: Well-involuted  Adnexa:  Non-palpable  Rectal Exam: no hemorrhoids        Assessment:   normal postpartum exam 5 wks s/p SVD Depression screening Contraception counseling   Plan:   Contraception: IUD Follow up in: 4 weeks for string check or as needed.   IUD insertion  The risks and benefits of the method and placement have been thouroughly reviewed with the patient and all questions were answered.  Specifically the patient is aware of failure rate of 01/998, expulsion of the IUD and of possible perforation.  The patient is aware of irregular  bleeding due to the method and understands the incidence of irregular bleeding diminishes with time.  Signed copy of informed consent in chart.   Time out was performed.  A Pederson speculum was placed in the vagina.  The cervix was visualized, prepped using Betadin, and grasped with a single tooth tenaculum. The uterus was found to be anteroflexed and it sounded to 7.5 cm.  Mirena IUD placed per manufacturer's recommendations.   The strings were trimmed to 3 cm.  The patient was given post procedure instructions, including signs and symptoms of infection and to check for the strings after each menses or each month, and refraining from intercourse or anything in the vagina for 3 days.  She was given a Mirena care card with date Mirena placed, and date Mirena to be removed.  She is scheduled for a return appointment after her first menses or 4 weeks.  Shanard Treto L Cathe Bilger 06/18/2013 10:30 AM

## 2013-07-16 ENCOUNTER — Ambulatory Visit (INDEPENDENT_AMBULATORY_CARE_PROVIDER_SITE_OTHER): Payer: Medicaid Other | Admitting: Family Medicine

## 2013-07-16 ENCOUNTER — Encounter: Payer: Self-pay | Admitting: Family Medicine

## 2013-07-16 VITALS — BP 102/61 | HR 75 | Temp 98.2°F | Ht 59.06 in | Wt 130.3 lb

## 2013-07-16 DIAGNOSIS — Z789 Other specified health status: Secondary | ICD-10-CM | POA: Diagnosis not present

## 2013-07-16 DIAGNOSIS — Z30431 Encounter for routine checking of intrauterine contraceptive device: Secondary | ICD-10-CM

## 2013-07-16 NOTE — Patient Instructions (Signed)
Colocacin de un dispositivo intrauterino - Cuidados posteriores (Intrauterine Device Insertion, Care After) Siga estas instrucciones durante las prximas semanas. Estas indicaciones le proporcionan informacin general acerca de cmo deber cuidarse despus del procedimiento. El mdico tambin podr darle instrucciones ms especficas. El tratamiento ha sido planificado segn las prcticas mdicas actuales, pero en algunos casos pueden ocurrir problemas. Comunquese con el mdico si tiene algn problema o tiene dudas despus del procedimiento. QU ESPERAR DESPUS DEL PROCEDIMIENTO La insercin del DIU puede causar molestias, como clicos. que deberan mejorar una vez que el DIU est en su lugar. Podr tener sangrado despus del procedimiento. Esto es normal. Vara desde un sangrado ligero durante un par de das hasta un sangrado similar al menstrual. Cuando el DIU est en su lugar, se extender un hilo de 1 a 2pulgadas (2,5 a 5cm) por el cuello del tero en la vagina. El hilo no debera molestarle a usted ni a su pareja. De lo contrario, consulte con su mdico.  INSTRUCCIONES PARA EL CUIDADO EN EL HOGAR   Controle su DIU para asegurarse de que est en su lugar, antes de reanudar la actividad sexual. Tiene que sentir los hilos. Si no los siente, algo puede estar mal. El DIU puede haberse salido del tero o ste puede haber sido atravesado (perforado) durante la colocacin. Adems, si los hilos son ms largos, puede significar que el DIU se est saliendo del tero. Si ocurre alguno de estos problemas, no estar protegida y podr quedar embarazada.  Puede volver a tener relaciones sexuales si no tiene problemas con el DIU. El DIU de cobre se considera efectivo y funciona de inmediato, si se inserta dentro de los 7 das del inicio del perodo. Ser necesario que utilice un mtodo anticonceptivo adicional durante 7 das, si el DIU se inserta en algn otro momento del ciclo.  Controle que el DIU sigue en su  lugar sintiendo los hilos despus de cada perodo menstrual.  Es posible que necesite tomar analgsicos, como acetaminofeno o ibuprofeno. Tome todos los medicamentos como le indic el mdico. SOLICITE ATENCIN MDICA SI:   Tiene un sangrado ms abundante o dura ms de un ciclo menstrual normal.  Tiene fiebre.  Siente clicos o dolor abdominal que no se alivian con medicamentos.  Siente dolor abdominal que no parece estar relacionado con el rea en que senta los clicos y el dolor anteriormente.  Se siente mareada, inusualmente dbil o se desmaya.  Tiene flujo vaginal u olores anormales.  Siente dolor durante las relaciones sexuales.  No puede sentir los hilos del DIU o los siente ms largos.  Siente que el DIU est en la abertura del cuello del tero, en la vagina.  Piensa que est embarazada o no tiene su perodo menstrual.  El hilo del DIU est lastimando a su pareja sexual. ASEGRESE DE QUE:  Comprende estas instrucciones.  Controlar su afeccin.  Recibir ayuda de inmediato si no mejora o si empeora. Document Released: 10/09/2011 Document Revised: 11/04/2012 Exit[employer] Patient Information 2015 [employer]. This information is not intended to replace advice given to you by your health care provider. Make sure you discuss any questions you have with your health care provider.  

## 2013-07-16 NOTE — Progress Notes (Signed)
S:  33 yo [id number] here for IUD string check - no problems with the IUD - has had some bleeding for approx 15 days.   - doesn't bother her but husband can feel the strings during intercourse and it bothers him  No fevers, chills, nausea, vomiting, abd pain.    O:  Filed Vitals:   07/16/13 0845  BP: 102/61  Pulse: 75  Temp: 98.2 F (36.8 C)   GU: NEFG, strings visible approx 4cm out of the os. Trimmed on exam.    A/P - doing well - IUD strings in place, trimmed - advised to return should husband still be bothered by them - otherwise f/u in 1 year.   Tagen Brethauer, [place] BasemanKELI L, MD

## 2013-09-21 ENCOUNTER — Encounter: Payer: Self-pay | Admitting: General Practice

## 2013-11-29 ENCOUNTER — Encounter: Payer: Self-pay | Admitting: Family Medicine

## 2014-05-08 IMAGING — US US OB FOLLOW-UP
1 series · 12 of 28 positions shown · non-contrast
Comparison: none

[Series 1: us ob follow up · 83 acquisitions, 12 frames shown]
[im 4/83]
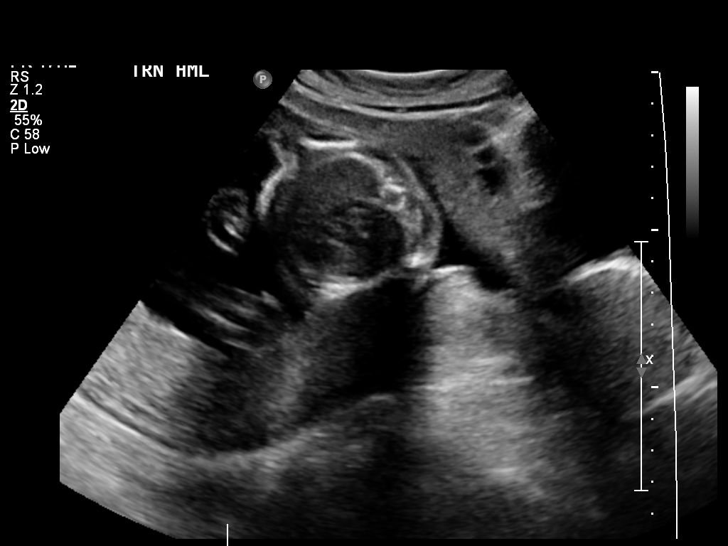
[im 10/83]
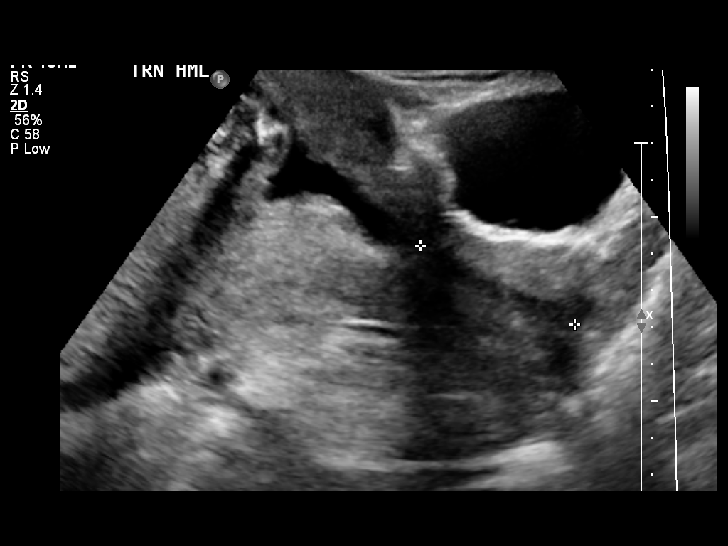
[im 16/83]
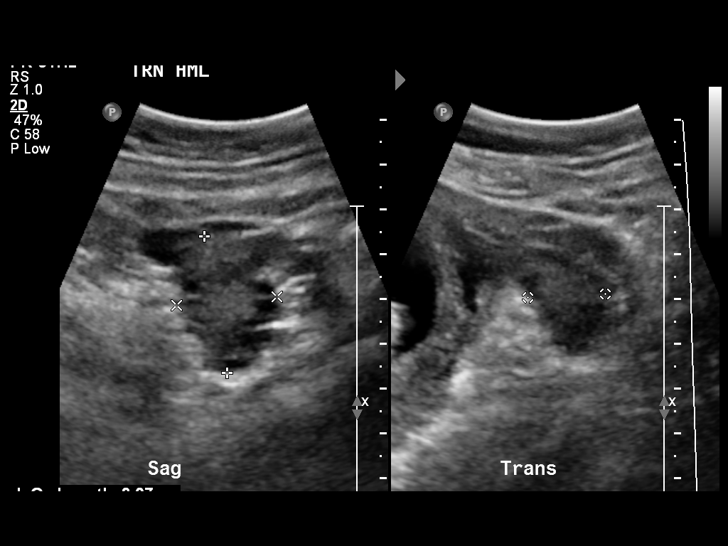
[im 25/83]
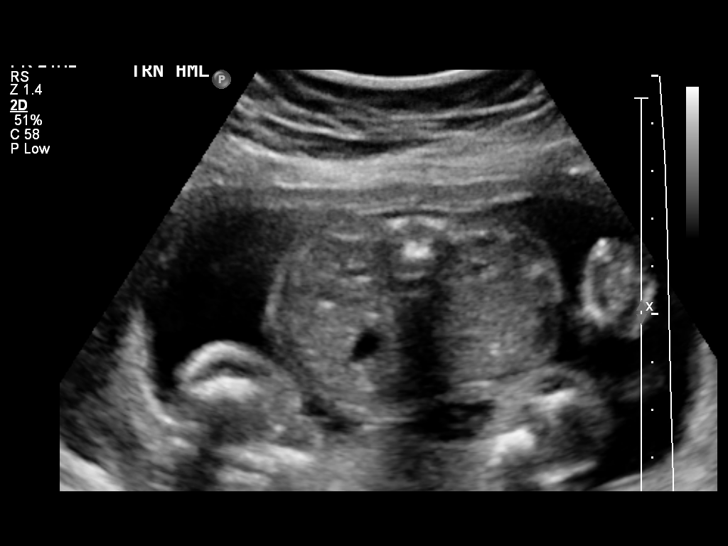
[im 31/83]
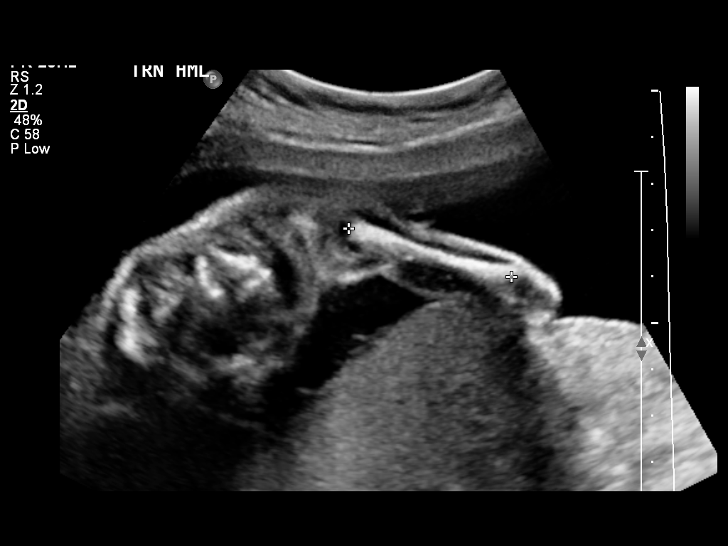
[im 37/83]
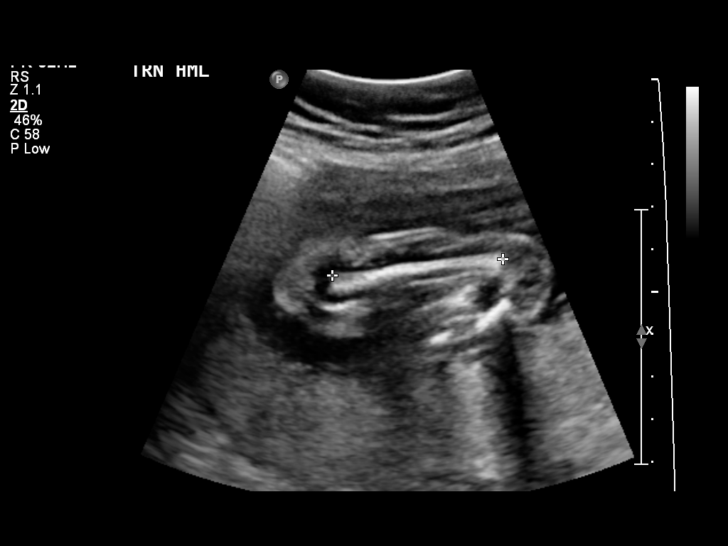
[im 46/83]
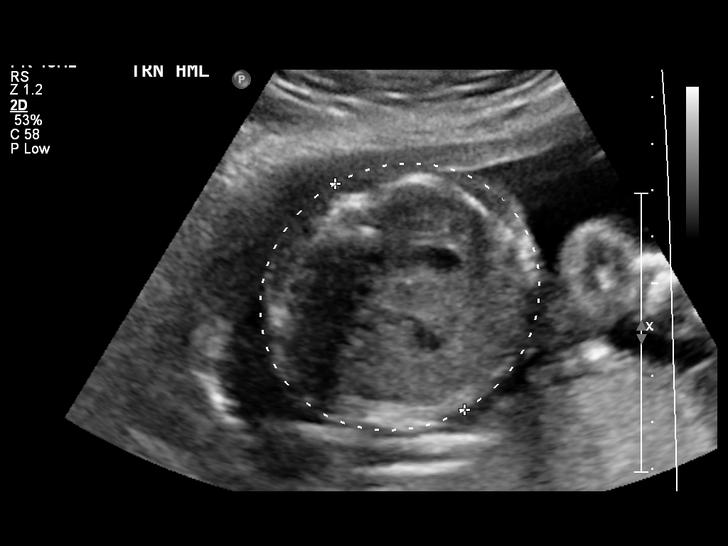
[im 52/83]
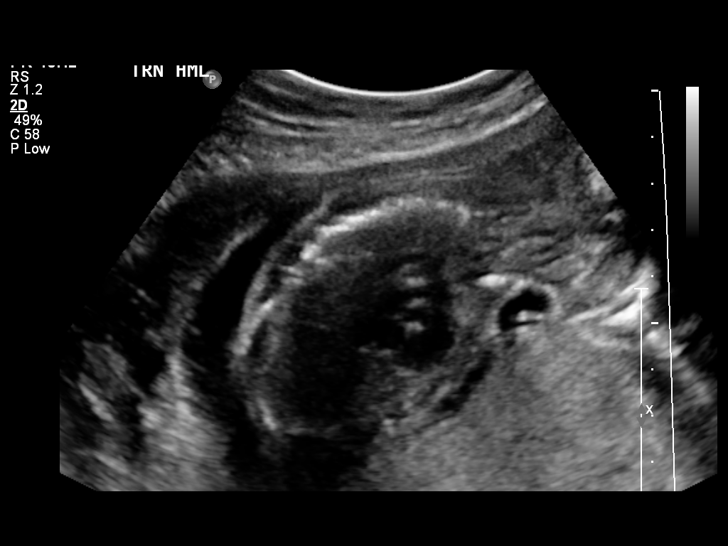
[im 58/83]
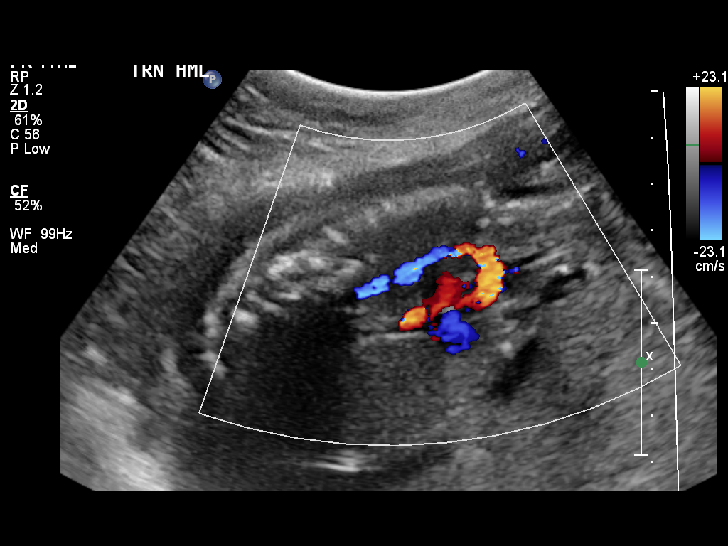
[im 67/83]
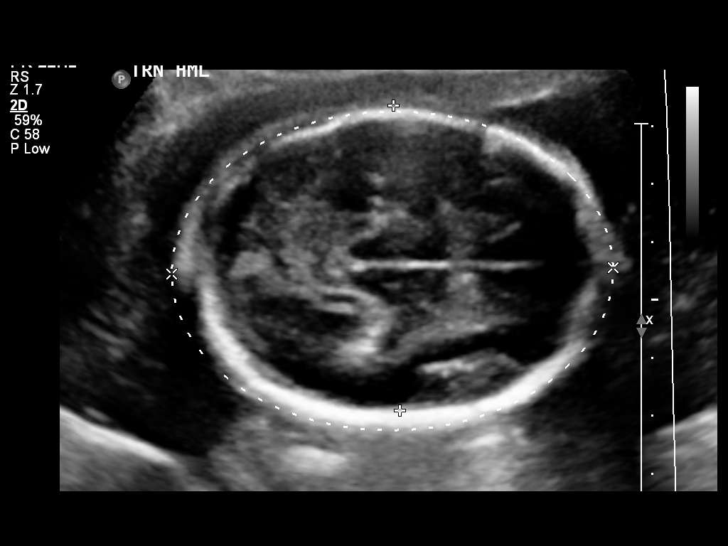
[im 73/83]
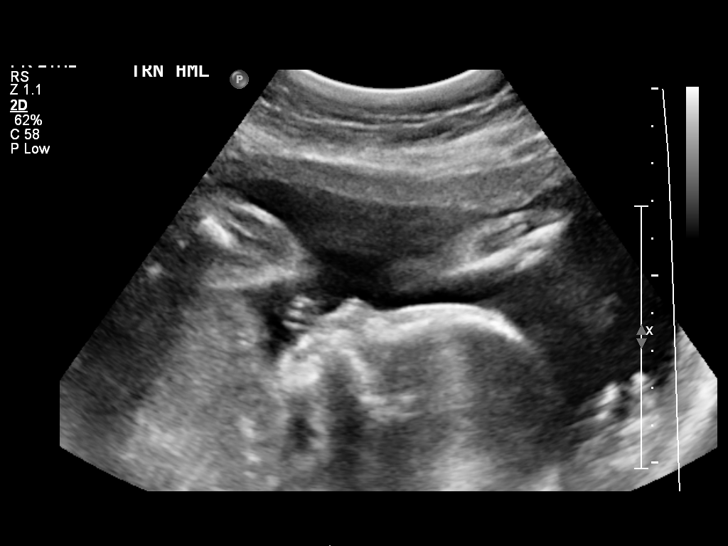
[im 79/83]
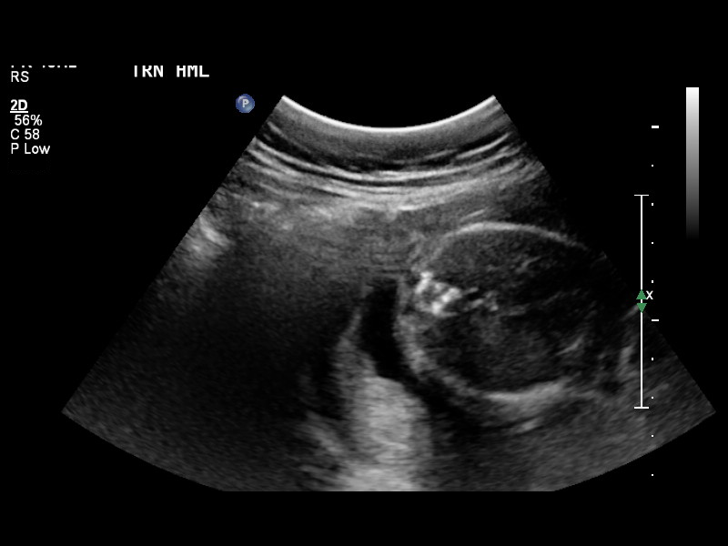

[12 of 28 positions shown; findings below may reference images not displayed]

OBSTETRICS REPORT
                      (Signed Final 01/12/2013 [DATE])

             [facility]

Service(s) Provided

 US OB FOLLOW UP                                       76816.1
Indications

 Evaluate anatomy not seen on prior sonogram
Fetal Evaluation

 Num Of Fetuses:    1
 Fetal Heart Rate:  150                          bpm
 Cardiac Activity:  Observed
 Presentation:      Variable
 Placenta:          Posterior, above cervical
                    os
 P. Cord            Previously Visualized
 Insertion:

 Amniotic Fluid
 AFI FV:      Subjectively within normal limits
                                             Larg Pckt:    5.64  cm
Biometry

 BPD:     52.7  mm     G. Age:  22w 0d                CI:        66.53   70 - 86
                                                      FL/HC:      19.6   19.2 -

 HC:     207.1  mm     G. Age:  22w 6d       28  %    HC/AC:      1.14   1.05 -

 AC:     181.8  mm     G. Age:  23w 0d       43  %    FL/BPD:     76.9   71 - 87
 FL:      40.5  mm     G. Age:  23w 1d       42  %    FL/AC:      22.3   20 - 24
 HUM:     36.7  mm     G. Age:  22w 6d       38  %

 Est. FW:     552  gm      1 lb 3 oz     53  %
Gestational Age

 LMP:           23w 0d        Date:  08/04/12                 EDD:   05/11/13
 U/S Today:     22w 5d                                        EDD:   05/13/13
 Best:          23w 0d     Det. By:  LMP  (08/04/12)          EDD:   05/11/13
Anatomy
 Cranium:          Appears normal         Aortic Arch:      Appears normal
 Fetal Cavum:      Appears normal         Ductal Arch:      Appears normal
 Ventricles:       Appears normal         Diaphragm:        Previously seen
 Choroid Plexus:   Previously seen        Stomach:          Appears normal, left
                                                            sided
 Cerebellum:       Previously seen        Abdomen:          Appears normal
 Posterior Fossa:  Previously seen        Abdominal Wall:   Previously seen
 Nuchal Fold:      Previously seen        Cord Vessels:     Previously seen
 Face:             Appears normal         Kidneys:          Appear normal
                   (orbits and profile)
 Lips:             Appears normal         Bladder:          Appears normal
 Palate:           Not well visualized    Spine:            Appears normal
 Heart:            Appears normal         Lower             Previously seen
                   (4CH, axis, and        Extremities:
                   situs)
 RVOT:             Appears normal         Upper             Previously seen
                                          Extremities:
 LVOT:             Appears normal

 Other:  Fetus appears to be a male. Technically difficult due to fetal position.
Targeted Anatomy

 Fetal Central Nervous System
 Lat. Ventricles:
Cervix Uterus Adnexa

 Cervical Length:    4.7      cm

 Cervix:       Normal appearance by transabdominal scan.
 Uterus:       No abnormality visualized.
 Cul De Sac:   No free fluid seen.
 Left Ovary:    Size(cm) L: 3.07 x W: 1.72 x H: 2.23  Volume(cc):
 Right Ovary:   Not visualized. No adnexal mass visualized.

 Adnexa:     No adnexal mass visualized.
Impression

 Single IUP at 23 0/7 weeks
 Normal interval anatomy
 Interval fetal growth is appropriate (53rd %tile)
 Posterior placenta without previa
 Normal amniotic fluid volume
Recommendations

 Follow-up ultrasounds as clinically indicated.
